# Patient Record
Sex: Male | Born: 1975 | Race: Black or African American | Smoking: Current every day smoker
Health system: Northeastern US, Academic
[De-identification: ages and names within clinical notes are randomized; demographics above are authoritative.]

## PROBLEM LIST (undated history)

## (undated) DIAGNOSIS — I10 Essential (primary) hypertension: Secondary | ICD-10-CM

## (undated) DIAGNOSIS — F1910 Other psychoactive substance abuse, uncomplicated: Secondary | ICD-10-CM

## (undated) DIAGNOSIS — I519 Heart disease, unspecified: Secondary | ICD-10-CM

## (undated) DIAGNOSIS — S81839A Puncture wound without foreign body, unspecified lower leg, initial encounter: Secondary | ICD-10-CM

## (undated) DIAGNOSIS — Z945 Skin transplant status: Secondary | ICD-10-CM

## (undated) DIAGNOSIS — W3400XA Accidental discharge from unspecified firearms or gun, initial encounter: Secondary | ICD-10-CM

## (undated) HISTORY — DX: Other psychoactive substance abuse, uncomplicated: F19.10

## (undated) HISTORY — PX: OTHER SURGICAL HISTORY: SHX169

## (undated) HISTORY — DX: Essential (primary) hypertension: I10

## (undated) HISTORY — DX: Puncture wound without foreign body, unspecified lower leg, initial encounter: S81.839A

## (undated) HISTORY — DX: Heart disease, unspecified: I51.9

---

## 2012-06-01 ENCOUNTER — Emergency Department (HOSPITAL_COMMUNITY): Payer: Medicare (Managed Care)

## 2012-06-01 ENCOUNTER — Emergency Department (HOSPITAL_COMMUNITY)
Admission: EM | Admit: 2012-06-01 | Discharge: 2012-06-01 | Disposition: A | Discharge status: Home / Self Care | Payer: Medicare (Managed Care) | Attending: Emergency Medicine | Admitting: Emergency Medicine

## 2012-06-01 DIAGNOSIS — I10 Essential (primary) hypertension: Secondary | ICD-10-CM | POA: Insufficient documentation

## 2012-06-01 DIAGNOSIS — R51 Headache: Secondary | ICD-10-CM | POA: Insufficient documentation

## 2012-06-01 DIAGNOSIS — M2550 Pain in unspecified joint: Secondary | ICD-10-CM | POA: Insufficient documentation

## 2012-06-01 LAB — CBC
HCT: 45.5 % (ref 39.0–52.0)
Hemoglobin: 16.9 g/dL (ref 13.0–17.0)
MCH: 33.9 pg (ref 26.0–34.0)
MCHC: 37.1 g/dL — ABNORMAL HIGH (ref 30.0–36.0)
MCV: 91.2 fL (ref 78.0–100.0)
RBC: 4.99 MIL/uL (ref 4.22–5.81)

## 2012-06-01 LAB — POCT I-STAT, CHEM 8
BUN: 12 mg/dL (ref 6–23)
Calcium, Ion: 1.2 mmol/L (ref 1.12–1.23)
Creatinine, Ser: 1 mg/dL (ref 0.50–1.35)
Glucose, Bld: 91 mg/dL (ref 70–99)
Hemoglobin: 17.3 g/dL — ABNORMAL HIGH (ref 13.0–17.0)
Sodium: 141 mEq/L (ref 135–145)
TCO2: 30 mmol/L (ref 0–100)

## 2012-06-01 LAB — POCT I-STAT TROPONIN I

## 2012-06-01 MED ORDER — HYDROCHLOROTHIAZIDE 25 MG PO TABS
25.0000 mg | ORAL_TABLET | Freq: Every day | ORAL | Status: DC
  Administered 2012-06-01: 25 mg via ORAL
  Filled 2012-06-01: qty 1

## 2012-06-01 MED ORDER — HYDROCHLOROTHIAZIDE 25 MG PO TABS: 25.0000 mg | ORAL_TABLET | Freq: Every day | ORAL | Status: DC

## 2012-06-01 NOTE — ED Notes (Signed)
Pt placed on monitor, EKG performed

## 2012-06-01 NOTE — ED Provider Notes (Signed)
History     CSN: [id number]  Arrival date & time 06/01/12  4098   First MD Initiated Contact with Patient 06/01/12 [phone]      Chief Complaint  Patient presents with  . Hypertension    (Consider location/radiation/quality/duration/timing/severity/associated sxs/prior treatment) HPI Comments: 37 y/o male with a PMHx of HTN presents to the ED with his girlfriend with concerns of worsening HTN x 2-3 months. Patient states he has been off of his HCTZ for the past few months after moving from [place], [place] and has been getting headaches on and off "when his blood pressure is up". Last night he had bilateral arm pain worse on the L, his girlfriend checked his blood pressure which was 165/115 and he had a headache at that time. Blood pressure this morning was still elevated and girlfriend advised him to go to the ED. Denies chest pain, sob, nausea, vomiting, visual changes, dizziness. Patient is a chronic daily smoker. Unsure of family hx of heart disease.   Patient is a 37 y.o. male presenting with hypertension. The history is provided by the patient and a significant other.  Hypertension Associated symptoms include arthralgias (positive for arm pain) and headaches. Pertinent negatives include no chest pain, chills, fever, nausea, neck pain or vomiting.    No past medical history on file.  No past surgical history on file.  No family history on file.  History  Substance Use Topics  . Smoking status: Not on file  . Smokeless tobacco: Not on file  . Alcohol Use: Not on file      Review of Systems  Constitutional: Negative for fever and chills.  HENT: Negative for neck pain and neck stiffness.   Eyes: Negative for visual disturbance.  Respiratory: Negative for shortness of breath.   Cardiovascular: Negative for chest pain.  Gastrointestinal: Negative for nausea and vomiting.  Musculoskeletal: Positive for arthralgias (positive for arm pain).  Neurological: Positive for headaches.  Negative for dizziness.  All other systems reviewed and are negative.    Allergies  Review of patient's allergies indicates no known allergies.  Home Medications   Current Outpatient Rx  Name  Route  Sig  Dispense  Refill  . ibuprofen (ADVIL,MOTRIN) 200 MG tablet   Oral   Take 400 mg by mouth every 6 (six) hours as needed for pain (for headache.).           BP 148/101  Pulse 89  Temp(Src) 98.5 F (36.9 C) (Oral)  Resp 18  Ht 5\' 8"  (1.727 m)  Wt 190 lb (86.183 kg)  BMI 28.9 kg/m2  SpO2 99%  Physical Exam  Nursing note and vitals reviewed. Constitutional: He is oriented to person, place, and time. He appears well-developed and well-nourished. No distress.  HENT:  Head: Normocephalic and atraumatic.  Mouth/Throat: Oropharynx is clear and moist.  Eyes: Conjunctivae and EOM are normal. Pupils are equal, round, and reactive to light.  Neck: Normal range of motion. Neck supple. No JVD present.  Cardiovascular: Normal rate, regular rhythm, normal heart sounds and intact distal pulses.   No extremity edema.  Pulmonary/Chest: Effort normal and breath sounds normal. No respiratory distress. He has no wheezes. He has no rales.  Abdominal: Soft. Bowel sounds are normal. There is no tenderness.  Musculoskeletal: Normal range of motion. He exhibits no edema.  Neurological: He is alert and oriented to person, place, and time. No sensory deficit.  Skin: Skin is warm and dry. He is not diaphoretic. No erythema. No pallor.  Psychiatric: He has a normal mood and affect. His behavior is normal.    ED Course  Procedures (including critical care time)  Labs Reviewed  CBC - Abnormal; Notable for the following:    MCHC 37.1 (*)    Platelets 146 (*)    All other components within normal limits  POCT I-STAT, CHEM 8 - Abnormal; Notable for the following:    Hemoglobin 17.3 (*)    All other components within normal limits  POCT I-STAT TROPONIN I   Dg Chest 2 View  06/01/2012   *RADIOLOGY REPORT*  Clinical Data: Hypertension headache  CHEST - 2 VIEW  Comparison: None.  Findings: Lungs are mildly hyperexpanded but clear.  Heart size and pulmonary vascularity are normal.  No adenopathy.  No bone lesions.  IMPRESSION: No edema or consolidation.  Lungs are mildly hyperexpanded.   Original Report Authenticated By: Bretta Bang, M.D.     Date: 06/01/2012  Rate: 60  Rhythm: normal sinus rhythm  QRS Axis: right  Intervals: normal  ST/T Wave abnormalities: normal  Conduction Disutrbances:none  Narrative Interpretation: no stemi  Old EKG Reviewed: none available    1. Hypertension       MDM  37 y/o male with HTN. Blood pressure in ED 152/115 - 148/101. Currently does not have a headache. Obtaining labs, ekg and cxr to r/o any cardiac problem associated with the HTN due to the L arm pain. Patient states it is bilateral arms and believes it is from working out, however girlfriend says last night he was complaining of left arm pain. Will give HCTZ 25mg  and if workup negative, will discharge with rx for HCTZ and follow up with PCP and outpatient cardiology. Case discussed with Dr. Judd Lien who agrees with plan of care.  10:59 AM Cardiac workup negative. EKG normal other than RAD. Labs and CXR without any acute problem. BP 132/101 in room at this time. Will discharge with HCTZ. Resource guide given for PCP follow up and he will f/u with cardiology outpatient. Return precautions discussed. Patient states understanding of plan and is agreeable.      Trevor Mace, PA-C 06/01/12 1101

## 2012-06-01 NOTE — ED Notes (Signed)
Pt has returned from being out of the department; placed back on continuous pulse oximetry and blood pressure cuff; family at bedside

## 2012-06-01 NOTE — ED Notes (Signed)
Pt undressed, in gown, on continuous pulse oximetry and blood pressure cuff; family at bedside 

## 2012-06-02 NOTE — ED Provider Notes (Signed)
Medical screening examination/treatment/procedure(s) were performed by non-physician practitioner and as supervising physician I was immediately available for consultation/collaboration.  Geoffery Lyons, MD 06/02/12 [phone]

## 2015-12-20 ENCOUNTER — Encounter (HOSPITAL_COMMUNITY): Payer: Self-pay

## 2015-12-20 ENCOUNTER — Emergency Department (HOSPITAL_COMMUNITY): Payer: Self-pay

## 2015-12-20 ENCOUNTER — Encounter (HOSPITAL_COMMUNITY): Payer: Self-pay | Admitting: Emergency Medicine

## 2015-12-20 ENCOUNTER — Emergency Department (HOSPITAL_COMMUNITY)
Admission: EM | Admit: 2015-12-20 | Discharge: 2015-12-20 | Disposition: A | Discharge status: Home / Self Care | Payer: Medicare (Managed Care) | Attending: Emergency Medicine | Admitting: Emergency Medicine

## 2015-12-20 ENCOUNTER — Emergency Department (HOSPITAL_COMMUNITY)
Admission: EM | Admit: 2015-12-20 | Discharge: 2015-12-20 | Disposition: A | Discharge status: Home / Self Care | Payer: Self-pay | Attending: Emergency Medicine | Admitting: Emergency Medicine

## 2015-12-20 DIAGNOSIS — F17210 Nicotine dependence, cigarettes, uncomplicated: Secondary | ICD-10-CM | POA: Insufficient documentation

## 2015-12-20 DIAGNOSIS — K2920 Alcoholic gastritis without bleeding: Secondary | ICD-10-CM | POA: Insufficient documentation

## 2015-12-20 DIAGNOSIS — Y939 Activity, unspecified: Secondary | ICD-10-CM | POA: Insufficient documentation

## 2015-12-20 DIAGNOSIS — R1084 Generalized abdominal pain: Secondary | ICD-10-CM

## 2015-12-20 DIAGNOSIS — I10 Essential (primary) hypertension: Secondary | ICD-10-CM | POA: Insufficient documentation

## 2015-12-20 DIAGNOSIS — Z79899 Other long term (current) drug therapy: Secondary | ICD-10-CM | POA: Insufficient documentation

## 2015-12-20 DIAGNOSIS — M7022 Olecranon bursitis, left elbow: Secondary | ICD-10-CM | POA: Insufficient documentation

## 2015-12-20 HISTORY — DX: Essential (primary) hypertension: I10

## 2015-12-20 LAB — ETHANOL: ALCOHOL ETHYL (B): 30 mg/dL — AB (ref <5)

## 2015-12-20 LAB — COMPREHENSIVE METABOLIC PANEL
ALK PHOS: 73 U/L (ref 38–126)
ALT: 26 U/L (ref 17–63)
ANION GAP: 13 (ref 5–15)
AST: 32 U/L (ref 15–41)
Albumin: 4.3 g/dL (ref 3.5–5.0)
BUN: 9 mg/dL (ref 6–20)
CALCIUM: 9.2 mg/dL (ref 8.9–10.3)
CO2: 21 mmol/L — ABNORMAL LOW (ref 22–32)
CREATININE: 0.95 mg/dL (ref 0.61–1.24)
Chloride: 102 mmol/L (ref 101–111)
Glucose, Bld: 102 mg/dL — ABNORMAL HIGH (ref 65–99)
Potassium: 3.7 mmol/L (ref 3.5–5.1)
Sodium: 136 mmol/L (ref 135–145)
TOTAL PROTEIN: 7.6 g/dL (ref 6.5–8.1)
Total Bilirubin: 0.9 mg/dL (ref 0.3–1.2)

## 2015-12-20 LAB — URINALYSIS, ROUTINE W REFLEX MICROSCOPIC
BILIRUBIN URINE: NEGATIVE
Glucose, UA: NEGATIVE mg/dL
Hgb urine dipstick: NEGATIVE
Ketones, ur: NEGATIVE mg/dL
LEUKOCYTES UA: NEGATIVE
NITRITE: NEGATIVE
Protein, ur: NEGATIVE mg/dL
SPECIFIC GRAVITY, URINE: 1.019 (ref 1.005–1.030)
pH: 6 (ref 5.0–8.0)

## 2015-12-20 LAB — CBC WITH DIFFERENTIAL/PLATELET
BASOS ABS: 0.1 10*3/uL (ref 0.0–0.1)
BASOS PCT: 1 %
EOS ABS: 0.5 10*3/uL (ref 0.0–0.7)
Eosinophils Relative: 8 %
HCT: 51 % (ref 39.0–52.0)
HEMOGLOBIN: 18.1 g/dL — AB (ref 13.0–17.0)
Lymphocytes Relative: 31 %
Lymphs Abs: 1.7 10*3/uL (ref 0.7–4.0)
MCH: 34 pg (ref 26.0–34.0)
MCHC: 35.5 g/dL (ref 30.0–36.0)
MCV: 95.7 fL (ref 78.0–100.0)
Monocytes Absolute: 0.7 10*3/uL (ref 0.1–1.0)
Monocytes Relative: 13 %
NEUTROS PCT: 47 %
Neutro Abs: 2.6 10*3/uL (ref 1.7–7.7)
Platelets: 177 10*3/uL (ref 150–400)
RBC: 5.33 MIL/uL (ref 4.22–5.81)
RDW: 13.3 % (ref 11.5–15.5)
WBC: 5.5 10*3/uL (ref 4.0–10.5)

## 2015-12-20 LAB — LIPASE, BLOOD: LIPASE: 29 U/L (ref 11–51)

## 2015-12-20 MED ORDER — ONDANSETRON 4 MG PO TBDP: 4.0000 mg | ORAL_TABLET | Freq: Three times a day (TID) | ORAL | 0 refills | Status: DC | PRN

## 2015-12-20 MED ORDER — SODIUM CHLORIDE 0.9 % IV SOLN: INTRAVENOUS | Status: DC

## 2015-12-20 MED ORDER — DOXYCYCLINE HYCLATE 100 MG PO TABS
100.0000 mg | ORAL_TABLET | Freq: Once | ORAL | Status: AC
  Administered 2015-12-20: 100 mg via ORAL
  Filled 2015-12-20: qty 1

## 2015-12-20 MED ORDER — DOXYCYCLINE HYCLATE 100 MG PO CAPS: 100.0000 mg | ORAL_CAPSULE | Freq: Two times a day (BID) | ORAL | 0 refills | Status: AC

## 2015-12-20 MED ORDER — SODIUM CHLORIDE 0.9 % IV BOLUS (SEPSIS)
1000.0000 mL | Freq: Once | INTRAVENOUS | Status: AC
  Administered 2015-12-20: 1000 mL via INTRAVENOUS

## 2015-12-20 MED ORDER — FAMOTIDINE IN NACL 20-0.9 MG/50ML-% IV SOLN
20.0000 mg | Freq: Once | INTRAVENOUS | Status: AC
  Administered 2015-12-20: 20 mg via INTRAVENOUS
  Filled 2015-12-20: qty 50

## 2015-12-20 MED ORDER — KETOROLAC TROMETHAMINE 60 MG/2ML IM SOLN
60.0000 mg | Freq: Once | INTRAMUSCULAR | Status: AC
  Administered 2015-12-20: 60 mg via INTRAMUSCULAR
  Filled 2015-12-20: qty 2

## 2015-12-20 MED ORDER — NAPROXEN 375 MG PO TABS: 375.0000 mg | ORAL_TABLET | Freq: Two times a day (BID) | ORAL | 0 refills | Status: AC

## 2015-12-20 MED ORDER — ONDANSETRON HCL 4 MG/2ML IJ SOLN
4.0000 mg | Freq: Once | INTRAMUSCULAR | Status: AC
  Administered 2015-12-20: 4 mg via INTRAVENOUS
  Filled 2015-12-20: qty 2

## 2015-12-20 MED ORDER — FAMOTIDINE 20 MG PO TABS: 20.0000 mg | ORAL_TABLET | Freq: Two times a day (BID) | ORAL | 0 refills | Status: AC

## 2015-12-20 NOTE — ED Triage Notes (Addendum)
Pt presents via EMS with pain and swelling to L elbow.  8/10 pain aching pain.  Also c/o headache and gas-like pain in chest.  EMS gave unknown amount of aspirin en route.  Pt sts the elbow pain is "weird" and he is concerned.

## 2015-12-20 NOTE — ED Provider Notes (Signed)
[facility]-EMERGENCY DEPT Provider Note   CSN: [id number] Arrival date & time: 12/20/15  [id number] By signing my name below, I, Bridgette HabermannMaria Tan, attest that this documentation has been prepared under the direction and in the presence of Shaune Pollackameron Evelen Vazguez, MD. Electronically Signed: Bridgette HabermannMaria Tan, ED Scribe. 12/20/15. 1:05 AM.  History   Chief Complaint Chief Complaint  Patient presents with  . Elbow Pain   HPI Comments: Spencer Greene is a 40 y.o. male with no pertinent PMHx, who presents to the Emergency Department by EMS complaining of aching and sharp left elbow pain with swelling onset one day ago. Pt also has associated generalized arthralgias/myalgias. He denies any recent injury or trauma but notes that he has been drinking alcohol tonight. Pain is exacerbated with movement. Pt has not tried any OTC medications PTA. Pt is right-hand dominant. Pt denies numbness, fever or any other associated symptoms. No rash or wounds. Denies pain with ROM at elbow.  The history is provided by the patient. No language interpreter was used.    No past medical history on file.  There are no active problems to display for this patient.   No past surgical history on file.     Home Medications    Prior to Admission medications   Not on File    Family History No family history on file.  Social History Social History  Substance Use Topics  . Smoking status: Not on file  . Smokeless tobacco: Not on file  . Alcohol use Not on file     Allergies   Review of patient's allergies indicates not on file.   Review of Systems Review of Systems  Constitutional: Negative for chills and fever.  Respiratory: Negative for shortness of breath.   Cardiovascular: Negative for chest pain.  Musculoskeletal: Positive for arthralgias, joint swelling and myalgias. Negative for neck pain.  Skin: Negative for rash and wound.  Allergic/Immunologic: Negative for immunocompromised state.  Neurological: Negative for weakness  and numbness.  Hematological: Does not bruise/bleed easily.     Physical Exam Updated Vital Signs BP (!) 143/104   Pulse 94   Temp 98.1 F (36.7 C) (Oral)   Resp 19   SpO2 96%   Physical Exam  Constitutional: He is oriented to person, place, and time. He appears well-developed and well-nourished. No distress.  HENT:  Head: Normocephalic and atraumatic.  Eyes: Conjunctivae are normal.  Neck: Neck supple.  Cardiovascular: Normal rate, regular rhythm and normal heart sounds.   Pulmonary/Chest: Effort normal. No respiratory distress. He has no wheezes.  Abdominal: He exhibits no distension.  Musculoskeletal: He exhibits no edema.  Neurological: He is alert and oriented to person, place, and time. He exhibits normal muscle tone.  Skin: Skin is warm. Capillary refill takes less than 2 seconds. No rash noted.  Nursing note and vitals reviewed.   UPPER EXTREMITY EXAM: Left  INSPECTION & PALPATION: Moderate tenderness to palpation directly over olecranon bursa. There is a superficial abrasion but no surrounding erythema, induration, or fluctuance. Range of motion is full and painless at the elbow, with only minimal tenderness upon full flexion and stretching of the bursa.  SENSORY: Sensation is intact to light touch in:  Superficial radial nerve distribution (dorsal first web space) Median nerve distribution (tip of index finger)   Ulnar nerve distribution (tip of small finger)     MOTOR:  + Motor posterior interosseous nerve (thumb IP extension) + Anterior interosseous nerve (thumb IP flexion, index finger DIP flexion) + Radial nerve (wrist  extension) + Median nerve (palpable firing thenar mass) + Ulnar nerve (palpable firing of first dorsal interosseous muscle)  VASCULAR: 2+ radial pulse Brisk capillary refill < 2 sec, fingers warm and well-perfused   ED Treatments / Results  DIAGNOSTIC STUDIES: Oxygen Saturation is 99% on RA, normal by my interpretation.     COORDINATION OF CARE: 1:04 AM Discussed treatment plan with pt at bedside which includes lab work, Rx of antibiotics and anti= and pt agreed to plan.  Labs (all labs ordered are listed, but only abnormal results are displayed) Labs Reviewed - No data to display  EKG  EKG Interpretation None       Radiology No results found.  Procedures Procedures (including critical care time)  Medications Ordered in ED Medications  ketorolac (TORADOL) injection 60 mg (not administered)  doxycycline (VIBRA-TABS) tablet 100 mg (not administered)     Initial Impression / Assessment and Plan / ED Course  I have reviewed the triage vital signs and the nursing notes.  Pertinent labs & imaging results that were available during my care of the patient were reviewed by me and considered in my medical decision making (see chart for details).  Clinical Course  40 year old previously healthy male who presents with mild, painful swelling over her left elbow. Exam is consistent with mild olecranon bursitis. There are signs of possible recent trauma and patient admits to recent alcohol use and possibly sleeping "on it.". No injection drug use. There is no erythema, induration, or signs of cellulitis. No fluctuance or evidence to suggest abscess. Plain films show no acute fracture. Suspect mild, likely traumatic olecranon bursitis. However, given poor follow-up and possible trauma to the area, will cover for possible early infectious process as well with doxycycline. Will give NSAIDs. Referred for outpt ortho follow-up.  Final Clinical Impressions(s) / ED Diagnoses   Final diagnoses:  Olecranon bursitis of left elbow    New Prescriptions Discharge Medication List as of 12/20/2015  2:06 AM    START taking these medications   Details  doxycycline (VIBRAMYCIN) 100 MG capsule Take 1 capsule (100 mg total) by mouth 2 (two) times daily., Starting Sat 12/20/2015, Until Sat 12/27/2015, Print    naproxen  (NAPROSYN) 375 MG tablet Take 1 tablet (375 mg total) by mouth 2 (two) times daily with a meal., Starting Sat 12/20/2015, Until Sat 12/27/2015, Print        I personally performed the services described in this documentation, which was scribed in my presence. The recorded information has been reviewed and is accurate.    Shaune Pollack, MD 12/20/15 [phone]

## 2015-12-20 NOTE — ED Triage Notes (Signed)
Pt. D/c here last night for HTN and arm pain. Pt. Unable to go home because abd. Pain so severe. Pt. Reports getting a shot last night that maybe could be causing his pain. Pt. Unable to sit without severe pain. EDP at bedside. Pt. Denies vomiting or diarrhea, but does endorse nausea.

## 2015-12-20 NOTE — ED Provider Notes (Signed)
[facility]PT Provider Note   CSN: [id number] Arrival date & time: 12/20/15  0750     History   Chief Complaint Chief Complaint  Patient presents with  . Abdominal Pain    HPI Spencer Greene is a 40 y.o. male.  Pt presents to the ED this morning due to abdominal pain.  The pt was seen in the ED last night and was dx'd with olecranon bursitis.  The pt was given an injection of toradol.  The pt was discharged around 0300 and was unable to get a ride home.  He has been in the waiting room since then.  He said that he felt very nauseous and his abdomen is painful.  The pt said that he never had anything like this.  He was drinking alcohol last night.      Past Medical History:  Diagnosis Date  . Hypertension     There are no active problems to display for this patient.   History reviewed. No pertinent surgical history.     Home Medications    Prior to Admission medications   Medication Sig Start Date End Date Taking? Authorizing Provider  hydrochlorothiazide (HYDRODIURIL) 25 MG tablet Take 1 tablet (25 mg total) by mouth daily. 06/01/12  Yes Robyn M Hess, PA-C  ibuprofen (ADVIL,MOTRIN) 200 MG tablet Take 400 mg by mouth every 6 (six) hours as needed for pain (for headache.).   Yes Historical Provider, MD  famotidine (PEPCID) 20 MG tablet Take 1 tablet (20 mg total) by mouth 2 (two) times daily. 12/20/15   Jacalyn LefevreJulie Jennylee Uehara, MD  ondansetron (ZOFRAN ODT) 4 MG disintegrating tablet Take 1 tablet (4 mg total) by mouth every 8 (eight) hours as needed for nausea or vomiting. 12/20/15   Jacalyn LefevreJulie Suresh Audi, MD    Family History History reviewed. No pertinent family history.  Social History Social History  Substance Use Topics  . Smoking status: Current Every Day Smoker    Packs/day: 1.50    Types: Cigarettes  . Smokeless tobacco: Never Used  . Alcohol use 7.2 oz/week    12 Cans of beer per week     Comment: 12 pack every other day     Allergies   Review of patient's  allergies indicates no known allergies.   Review of Systems Review of Systems  Gastrointestinal: Positive for abdominal pain and nausea.  All other systems reviewed and are negative.    Physical Exam Updated Vital Signs BP (!) 158/114   Pulse 94   Temp 97.9 F (36.6 C) (Oral)   Resp 15   Ht 5\' 7"  (1.702 m)   Wt 160 lb (72.6 kg)   SpO2 97%   BMI 25.06 kg/m   Physical Exam  Constitutional: He is oriented to person, place, and time. He appears well-developed and well-nourished.  HENT:  Head: Normocephalic and atraumatic.  Right Ear: External ear normal.  Left Ear: External ear normal.  Nose: Nose normal.  Mouth/Throat: Oropharynx is clear and moist.  Eyes: Conjunctivae and EOM are normal. Pupils are equal, round, and reactive to light.  Neck: Normal range of motion. Neck supple.  Cardiovascular: Normal rate, regular rhythm, normal heart sounds and intact distal pulses.   Pulmonary/Chest: Effort normal and breath sounds normal.  Abdominal: Soft. Bowel sounds are normal. There is tenderness in the epigastric area.  Musculoskeletal: Normal range of motion.  Neurological: He is alert and oriented to person, place, and time.  Skin: Skin is warm.  Psychiatric: He has a normal mood  and affect. His behavior is normal. Thought content normal.  Nursing note and vitals reviewed.    ED Treatments / Results  Labs (all labs ordered are listed, but only abnormal results are displayed) Labs Reviewed  COMPREHENSIVE METABOLIC PANEL - Abnormal; Notable for the following:       Result Value   CO2 21 (*)    Glucose, Bld 102 (*)    All other components within normal limits  CBC WITH DIFFERENTIAL/PLATELET - Abnormal; Notable for the following:    Hemoglobin 18.1 (*)    All other components within normal limits  URINALYSIS, ROUTINE W REFLEX MICROSCOPIC (NOT AT [facility]) - Abnormal; Notable for the following:    Color, Urine AMBER (*)    All other components within normal limits  ETHANOL -  Abnormal; Notable for the following:    Alcohol, Ethyl (B) 30 (*)    All other components within normal limits  LIPASE, BLOOD    EKG  EKG Interpretation None       Radiology No results found.  Procedures Procedures (including critical care time)  Medications Ordered in ED Medications  sodium chloride 0.9 % bolus 1,000 mL (0 mLs Intravenous Stopped 12/20/15 0926)    And  0.9 %  sodium chloride infusion (not administered)  ondansetron (ZOFRAN) injection 4 mg (4 mg Intravenous Given 12/20/15 0810)  famotidine (PEPCID) IVPB 20 mg premix (0 mg Intravenous Stopped 12/20/15 0926)     Initial Impression / Assessment and Plan / ED Course  I have reviewed the triage vital signs and the nursing notes.  Pertinent labs & imaging results that were available during my care of the patient were reviewed by me and considered in my medical decision making (see chart for details).  Clinical Course    Pt's alcohol was still positive this morning, so I suspect he had quite a bit to drink last night.  I think the combination of toradol and alcohol inflamed his stomach.  The pt is feeling better and is encouraged to avoid alcohol.  He knows to return if worse.  Final Clinical Impressions(s) / ED Diagnoses   Final diagnoses:  Generalized abdominal pain  Acute alcoholic gastritis without hemorrhage    New Prescriptions New Prescriptions   FAMOTIDINE (PEPCID) 20 MG TABLET    Take 1 tablet (20 mg total) by mouth 2 (two) times daily.   ONDANSETRON (ZOFRAN ODT) 4 MG DISINTEGRATING TABLET    Take 1 tablet (4 mg total) by mouth every 8 (eight) hours as needed for nausea or vomiting.     Jacalyn Lefevre, MD 12/20/15 1043

## 2015-12-20 NOTE — ED Notes (Signed)
Patient transported to X-ray via stretcher 

## 2015-12-21 ENCOUNTER — Encounter (HOSPITAL_COMMUNITY): Payer: Self-pay

## 2015-12-25 ENCOUNTER — Emergency Department (HOSPITAL_COMMUNITY)
Admission: EM | Admit: 2015-12-25 | Discharge: 2015-12-25 | Disposition: A | Discharge status: Home / Self Care | Payer: Medicare (Managed Care) | Attending: Emergency Medicine | Admitting: Emergency Medicine

## 2015-12-25 ENCOUNTER — Encounter (HOSPITAL_COMMUNITY): Payer: Self-pay | Admitting: Emergency Medicine

## 2015-12-25 DIAGNOSIS — F17210 Nicotine dependence, cigarettes, uncomplicated: Secondary | ICD-10-CM | POA: Insufficient documentation

## 2015-12-25 DIAGNOSIS — K2900 Acute gastritis without bleeding: Secondary | ICD-10-CM | POA: Insufficient documentation

## 2015-12-25 DIAGNOSIS — I10 Essential (primary) hypertension: Secondary | ICD-10-CM | POA: Insufficient documentation

## 2015-12-25 DIAGNOSIS — Z79899 Other long term (current) drug therapy: Secondary | ICD-10-CM | POA: Insufficient documentation

## 2015-12-25 LAB — CBC
HEMATOCRIT: 47.1 % (ref 39.0–52.0)
HEMOGLOBIN: 16.5 g/dL (ref 13.0–17.0)
MCH: 33.7 pg (ref 26.0–34.0)
MCHC: 35 g/dL (ref 30.0–36.0)
MCV: 96.3 fL (ref 78.0–100.0)
Platelets: 144 10*3/uL — ABNORMAL LOW (ref 150–400)
RBC: 4.89 MIL/uL (ref 4.22–5.81)
RDW: 13.1 % (ref 11.5–15.5)
WBC: 4.6 10*3/uL (ref 4.0–10.5)

## 2015-12-25 LAB — URINALYSIS, ROUTINE W REFLEX MICROSCOPIC
Bilirubin Urine: NEGATIVE
GLUCOSE, UA: NEGATIVE mg/dL
Hgb urine dipstick: NEGATIVE
Ketones, ur: NEGATIVE mg/dL
LEUKOCYTES UA: NEGATIVE
NITRITE: NEGATIVE
PH: 6 (ref 5.0–8.0)
Protein, ur: NEGATIVE mg/dL
SPECIFIC GRAVITY, URINE: 1.012 (ref 1.005–1.030)

## 2015-12-25 LAB — COMPREHENSIVE METABOLIC PANEL
ALT: 39 U/L (ref 17–63)
AST: 40 U/L (ref 15–41)
Albumin: 4.5 g/dL (ref 3.5–5.0)
Alkaline Phosphatase: 69 U/L (ref 38–126)
Anion gap: 9 (ref 5–15)
BUN: 12 mg/dL (ref 6–20)
CO2: 25 mmol/L (ref 22–32)
Calcium: 9 mg/dL (ref 8.9–10.3)
Chloride: 103 mmol/L (ref 101–111)
Creatinine, Ser: 0.99 mg/dL (ref 0.61–1.24)
GFR calc Af Amer: >60 mL/min (ref >60)
GFR calc non Af Amer: >60 mL/min (ref >60)
Glucose, Bld: 107 mg/dL — ABNORMAL HIGH (ref 65–99)
Potassium: 3.6 mmol/L (ref 3.5–5.1)
Sodium: 137 mmol/L (ref 135–145)
Total Bilirubin: 0.8 mg/dL (ref 0.3–1.2)
Total Protein: 7.6 g/dL (ref 6.5–8.1)

## 2015-12-25 LAB — LIPASE, BLOOD: LIPASE: 36 U/L (ref 11–51)

## 2015-12-25 MED ORDER — SODIUM CHLORIDE 0.9 % IV BOLUS (SEPSIS)
1000.0000 mL | Freq: Once | INTRAVENOUS | Status: AC
  Administered 2015-12-25: 1000 mL via INTRAVENOUS

## 2015-12-25 MED ORDER — GI COCKTAIL ~~LOC~~
30.0000 mL | Freq: Once | ORAL | Status: AC
  Administered 2015-12-25: 30 mL via ORAL
  Filled 2015-12-25: qty 30

## 2015-12-25 MED ORDER — FAMOTIDINE IN NACL 20-0.9 MG/50ML-% IV SOLN
20.0000 mg | Freq: Once | INTRAVENOUS | Status: AC
  Administered 2015-12-25: 20 mg via INTRAVENOUS
  Filled 2015-12-25: qty 50

## 2015-12-25 MED ORDER — ONDANSETRON HCL 4 MG/2ML IJ SOLN
4.0000 mg | Freq: Once | INTRAMUSCULAR | Status: AC
  Administered 2015-12-25: 4 mg via INTRAVENOUS
  Filled 2015-12-25: qty 2

## 2015-12-25 MED ORDER — ONDANSETRON 4 MG PO TBDP: 4.0000 mg | ORAL_TABLET | Freq: Three times a day (TID) | ORAL | 0 refills | Status: AC | PRN

## 2015-12-25 NOTE — ED Provider Notes (Signed)
[facility]-EMERGENCY DEPT Provider Note   CSN: [id number] Arrival date & time: 12/25/15  0124     History   Chief Complaint Chief Complaint  Patient presents with  . Abdominal Pain  . Emesis    HPI Spencer Greene is a 40 y.o. male.  Patient is a 40 year old male with no pertinent past medical history presents the ED with complaint of abdominal pain, onset one week. Patient reports he has had constant waxing and waning sharp diffuse abdominal pain for the past week. Denies any aggravating or alleviating factors. Endorses associated nausea and NBNB vomiting. He also reports having episodes of nonbloody loose stool over the past 2 days. Denies fever, chills, chest pain, difficulty breathing, hematemesis, urinary symptoms, blood in urine or stool. Patient reports she has not been able to keep anything down due to his nausea and vomiting. He states he was seen in the ED last week, was advised to stop drinking alcohol and was discharged home with Prilosec. Patient reports he has taken 2 doses of Prilosec without relief and states he stopped taking the medication due to having continued abdominal pain. Patient reports drinking 2 beers daily and notes he last had a beer yesterday. Denies history of abdominal surgeries.      Past Medical History:  Diagnosis Date  . Hypertension     There are no active problems to display for this patient.   History reviewed. No pertinent surgical history.     Home Medications    Prior to Admission medications   Medication Sig Start Date End Date Taking? Authorizing Provider  hydrochlorothiazide (HYDRODIURIL) 25 MG tablet Take 1 tablet (25 mg total) by mouth daily. 06/01/12  Yes Robyn M Hess, PA-C  ibuprofen (ADVIL,MOTRIN) 200 MG tablet Take 400 mg by mouth every 6 (six) hours as needed for pain (for headache.).   Yes Historical Provider, MD  doxycycline (VIBRAMYCIN) 100 MG capsule Take 1 capsule (100 mg total) by mouth 2 (two) times daily. Patient not  taking: Reported on 12/25/2015 12/20/15 12/27/15  Shaune Pollackameron Isaacs, MD  famotidine (PEPCID) 20 MG tablet Take 1 tablet (20 mg total) by mouth 2 (two) times daily. Patient not taking: Reported on 12/25/2015 12/20/15   Jacalyn LefevreJulie Haviland, MD  naproxen (NAPROSYN) 375 MG tablet Take 1 tablet (375 mg total) by mouth 2 (two) times daily with a meal. Patient not taking: Reported on 12/25/2015 12/20/15 12/27/15  Shaune Pollackameron Isaacs, MD  ondansetron (ZOFRAN ODT) 4 MG disintegrating tablet Take 1 tablet (4 mg total) by mouth every 8 (eight) hours as needed for nausea or vomiting. 12/25/15   Chase PicketJaime Pilcher Ward, PA-C    Family History History reviewed. No pertinent family history.  Social History Social History  Substance Use Topics  . Smoking status: Current Every Day Smoker    Packs/day: 1.50    Types: Cigarettes  . Smokeless tobacco: Never Used  . Alcohol use 7.2 oz/week    12 Cans of beer per week     Comment: 12 pack every other day     Allergies   Review of patient's allergies indicates no known allergies.   Review of Systems Review of Systems  Gastrointestinal: Positive for abdominal pain, diarrhea, nausea and vomiting.  All other systems reviewed and are negative.    Physical Exam Updated Vital Signs BP (!) 149/111   Pulse 85   Temp 97.8 F (36.6 C) (Oral)   Resp 18   Ht 5\' 7"  (1.702 m)   Wt 72.6 kg   SpO2 98%  BMI 25.06 kg/m   Physical Exam  Constitutional: He is oriented to person, place, and time. He appears well-developed and well-nourished. No distress.  HENT:  Head: Normocephalic and atraumatic.  Mouth/Throat: Oropharynx is clear and moist. No oropharyngeal exudate.  Eyes: Conjunctivae and EOM are normal. Right eye exhibits no discharge. Left eye exhibits no discharge. No scleral icterus.  Neck: Normal range of motion. Neck supple.  Cardiovascular: Normal rate, regular rhythm, normal heart sounds and intact distal pulses.   Pulmonary/Chest: Effort normal and breath  sounds normal. No respiratory distress. He has no wheezes. He has no rales. He exhibits no tenderness.  Abdominal: Soft. Bowel sounds are normal. He exhibits no distension and no mass. There is tenderness (epigastric). There is no rebound and no guarding. No hernia.  No CVA tenderness  Musculoskeletal: Normal range of motion. He exhibits no edema.  Neurological: He is alert and oriented to person, place, and time.  Skin: Skin is warm and dry. He is not diaphoretic.  Nursing note and vitals reviewed.    ED Treatments / Results  Labs (all labs ordered are listed, but only abnormal results are displayed) Labs Reviewed  COMPREHENSIVE METABOLIC PANEL - Abnormal; Notable for the following:       Result Value   Glucose, Bld 107 (*)    All other components within normal limits  CBC - Abnormal; Notable for the following:    Platelets 144 (*)    All other components within normal limits  LIPASE, BLOOD  URINALYSIS, ROUTINE W REFLEX MICROSCOPIC (NOT AT [facility])    EKG  EKG Interpretation None       Radiology No results found.  Procedures Procedures (including critical care time)  Medications Ordered in ED Medications  ondansetron (ZOFRAN) injection 4 mg (4 mg Intravenous Given 12/25/15 0508)  famotidine (PEPCID) IVPB 20 mg premix (0 mg Intravenous Stopped 12/25/15 0557)  gi cocktail (Maalox,Lidocaine,Donnatal) (30 mLs Oral Given 12/25/15 4098)  sodium chloride 0.9 % bolus 1,000 mL (0 mLs Intravenous Stopped 12/25/15 0813)     Initial Impression / Assessment and Plan / ED Course  I have reviewed the triage vital signs and the nursing notes.  Pertinent labs & imaging results that were available during my care of the patient were reviewed by me and considered in my medical decision making (see chart for details).  Clinical Course   Pt presents with abdominal pain, N/V for the past week. Endorses drinking alcohol daily, last drank 2 days ago. Reports he was seen in the ED for  similar sxs on 10/21 and was d/c home with prilosec which he has taken twice without relief. VSS. Exam revealed mild TTP over epigastric region, no peritoneal signs. MMM. Remaining exam unremarkable. Pt given antiemetics and pepcid. Labs and UA unremarkable. On reevaluation pt reports his N/V have resolved but he continues to have abdominal pain. Pt given GI cocktail. Suspect pt's sxs are likely due to alcohol abuse/reflux. Plan to reevaluation pt s/p fluid challenge with plan to d/c pt home with instructions regarding alcohol cessation.   Hand-off to [facility], PA-C.   Final Clinical Impressions(s) / ED Diagnoses   Final diagnoses:  Acute gastritis without hemorrhage, unspecified gastritis type    New Prescriptions Discharge Medication List as of 12/25/2015  7:41 AM       Barrett Henle, PA-C 12/25/15 1458    Tomasita Crumble, MD 12/25/15 2302

## 2015-12-25 NOTE — Discharge Instructions (Signed)
Please follow-up with your primary care provider or the GI clinic listed above. I have provided information for a primary care provider in the area if you need to find a doctor. Take Zofran as needed for nausea. Continue taking Pepcid as directed. It is important to quit or decrease your alcohol consumption to help improve your abdominal pain.  Please seek immediate care if you develop any of the following symptoms: The pain does not go away.  You have a fever.  You keep throwing up (vomiting).  You pass bloody or black tarry stools.  There is bright red blood in the stool.  There is rectal pain.  You do not seem to be getting better.  You have any questions or concerns.

## 2015-12-25 NOTE — ED Provider Notes (Signed)
Care assumed from previous provider PA Nadeau. Please see note for further details. Case discussed, plan agreed upon. Will re-evaluate following GI cocktail and PO challenge with likely discharge to home.   7:32 AM - patient reevaluated following fluid bolus and GI cocktail. States n/v have resolved. Able to tolerate PO. Mildly improved abdominal pain. Abdominal exam soft and nondistended with no peritoneal signs. Mild tenderness to palpation of the epigastrium. Negative Murphy's. Agree with assessment of previous provider. Will give Rx for Zofran. Continue Pepcid. Discussed ETOH cessation. PCP or GI follow-up discussed and information given at discharge. Reasons to return to ED discussed and all questions answered.    [facility] Lc[facility]ime Pilcher Mykael Trott, PA-C 12/25/15 [phone]    Benjiman CoreNathan Pickering, MD 12/25/15 1534

## 2015-12-25 NOTE — ED Notes (Signed)
Pt refused to sign discharge paperwork, stating this visit was just "a waste of time". He left the department mumbling his dissatisfactions.

## 2015-12-25 NOTE — ED Triage Notes (Signed)
Pt reports having abd pain with nausea, vomiting, and diarrhea. Pt states that he stopped drinking alcohol and pain has not improved. Also reports having elevated blood pressure and not currently on medications.

## 2016-05-28 ENCOUNTER — Encounter (HOSPITAL_COMMUNITY): Payer: Self-pay | Admitting: *Deleted

## 2016-05-28 ENCOUNTER — Emergency Department (HOSPITAL_COMMUNITY)
Admission: EM | Admit: 2016-05-28 | Discharge: 2016-05-28 | Disposition: A | Discharge status: Home / Self Care | Payer: Medicare (Managed Care) | Attending: Dermatology | Admitting: Dermatology

## 2016-05-28 DIAGNOSIS — R109 Unspecified abdominal pain: Secondary | ICD-10-CM | POA: Insufficient documentation

## 2016-05-28 DIAGNOSIS — Z5321 Procedure and treatment not carried out due to patient leaving prior to being seen by health care provider: Secondary | ICD-10-CM | POA: Insufficient documentation

## 2016-05-28 LAB — CBC
HCT: 45.9 % (ref 39.0–52.0)
Hemoglobin: 16.6 g/dL (ref 13.0–17.0)
MCH: 33.7 pg (ref 26.0–34.0)
MCHC: 36.2 g/dL — AB (ref 30.0–36.0)
MCV: 93.3 fL (ref 78.0–100.0)
PLATELETS: 158 10*3/uL (ref 150–400)
RBC: 4.92 MIL/uL (ref 4.22–5.81)
RDW: 13.4 % (ref 11.5–15.5)
WBC: 6.1 10*3/uL (ref 4.0–10.5)

## 2016-05-28 LAB — COMPREHENSIVE METABOLIC PANEL
ALK PHOS: 72 U/L (ref 38–126)
ALT: 30 U/L (ref 17–63)
AST: 32 U/L (ref 15–41)
Albumin: 4.4 g/dL (ref 3.5–5.0)
Anion gap: 13 (ref 5–15)
BUN: 5 mg/dL — AB (ref 6–20)
CALCIUM: 9.2 mg/dL (ref 8.9–10.3)
CO2: 22 mmol/L (ref 22–32)
CREATININE: 0.91 mg/dL (ref 0.61–1.24)
Chloride: 101 mmol/L (ref 101–111)
GFR calc Af Amer: >60 mL/min (ref >60)
GFR calc non Af Amer: >60 mL/min (ref >60)
GLUCOSE: 102 mg/dL — AB (ref 65–99)
Potassium: 3.4 mmol/L — ABNORMAL LOW (ref 3.5–5.1)
SODIUM: 136 mmol/L (ref 135–145)
Total Bilirubin: 0.5 mg/dL (ref 0.3–1.2)
Total Protein: 7.6 g/dL (ref 6.5–8.1)

## 2016-05-28 LAB — LIPASE, BLOOD: Lipase: 15 U/L (ref 11–51)

## 2016-05-28 NOTE — ED Notes (Signed)
Called to take Pt to [facility]- No Answer. Times 3

## 2016-05-28 NOTE — ED Notes (Signed)
Pt reports pain under L ribcage, pt states, "It feels like gas pains." denies recent cocaine use

## 2016-05-28 NOTE — ED Triage Notes (Addendum)
Pt c/o abd pain onset yesterday, Last BM yesterday, pt reports x 2 vomiting episodes, pt c/o HA, pt reports drinking beer last night, pt states, "I ate some burnt ribs at work yesterday, A&O x4

## 2016-06-16 ENCOUNTER — Encounter (HOSPITAL_COMMUNITY): Payer: Self-pay | Admitting: Emergency Medicine

## 2016-06-16 ENCOUNTER — Emergency Department (HOSPITAL_COMMUNITY)
Admission: EM | Admit: 2016-06-16 | Discharge: 2016-06-16 | Disposition: A | Discharge status: Home / Self Care | Payer: Medicare (Managed Care) | Attending: Emergency Medicine | Admitting: Emergency Medicine

## 2016-06-16 DIAGNOSIS — F17210 Nicotine dependence, cigarettes, uncomplicated: Secondary | ICD-10-CM | POA: Insufficient documentation

## 2016-06-16 DIAGNOSIS — F10120 Alcohol abuse with intoxication, uncomplicated: Secondary | ICD-10-CM | POA: Insufficient documentation

## 2016-06-16 DIAGNOSIS — F1910 Other psychoactive substance abuse, uncomplicated: Secondary | ICD-10-CM

## 2016-06-16 DIAGNOSIS — Z79899 Other long term (current) drug therapy: Secondary | ICD-10-CM | POA: Insufficient documentation

## 2016-06-16 DIAGNOSIS — F1410 Cocaine abuse, uncomplicated: Secondary | ICD-10-CM | POA: Insufficient documentation

## 2016-06-16 DIAGNOSIS — F10920 Alcohol use, unspecified with intoxication, uncomplicated: Secondary | ICD-10-CM

## 2016-06-16 DIAGNOSIS — I10 Essential (primary) hypertension: Secondary | ICD-10-CM | POA: Insufficient documentation

## 2016-06-16 DIAGNOSIS — R319 Hematuria, unspecified: Secondary | ICD-10-CM

## 2016-06-16 DIAGNOSIS — F1210 Cannabis abuse, uncomplicated: Secondary | ICD-10-CM | POA: Insufficient documentation

## 2016-06-16 LAB — COMPREHENSIVE METABOLIC PANEL
ALK PHOS: 82 U/L (ref 38–126)
ALT: 24 U/L (ref 17–63)
ANION GAP: 11 (ref 5–15)
AST: 26 U/L (ref 15–41)
Albumin: 4.6 g/dL (ref 3.5–5.0)
BUN: 9 mg/dL (ref 6–20)
CO2: 26 mmol/L (ref 22–32)
CREATININE: 0.97 mg/dL (ref 0.61–1.24)
Calcium: 9.4 mg/dL (ref 8.9–10.3)
Chloride: 102 mmol/L (ref 101–111)
GFR calc non Af Amer: >60 mL/min (ref >60)
Glucose, Bld: 126 mg/dL — ABNORMAL HIGH (ref 65–99)
Potassium: 3.3 mmol/L — ABNORMAL LOW (ref 3.5–5.1)
SODIUM: 139 mmol/L (ref 135–145)
Total Bilirubin: 0.4 mg/dL (ref 0.3–1.2)
Total Protein: 8.3 g/dL — ABNORMAL HIGH (ref 6.5–8.1)

## 2016-06-16 LAB — CBC
HEMATOCRIT: 49.1 % (ref 39.0–52.0)
Hemoglobin: 17.9 g/dL — ABNORMAL HIGH (ref 13.0–17.0)
MCH: 34.2 pg — ABNORMAL HIGH (ref 26.0–34.0)
MCHC: 36.5 g/dL — AB (ref 30.0–36.0)
MCV: 93.9 fL (ref 78.0–100.0)
Platelets: 179 10*3/uL (ref 150–400)
RBC: 5.23 MIL/uL (ref 4.22–5.81)
RDW: 13.7 % (ref 11.5–15.5)
WBC: 9.9 10*3/uL (ref 4.0–10.5)

## 2016-06-16 LAB — RAPID URINE DRUG SCREEN, HOSP PERFORMED
Amphetamines: NOT DETECTED
BARBITURATES: NOT DETECTED
Benzodiazepines: NOT DETECTED
Cocaine: POSITIVE — AB
Opiates: NOT DETECTED
Tetrahydrocannabinol: POSITIVE — AB

## 2016-06-16 LAB — URINALYSIS, ROUTINE W REFLEX MICROSCOPIC
BILIRUBIN URINE: NEGATIVE
Bacteria, UA: NONE SEEN
GLUCOSE, UA: NEGATIVE mg/dL
Hgb urine dipstick: NEGATIVE
KETONES UR: NEGATIVE mg/dL
LEUKOCYTES UA: NEGATIVE
Nitrite: NEGATIVE
PH: 5 (ref 5.0–8.0)
Protein, ur: NEGATIVE mg/dL
Specific Gravity, Urine: 1.012 (ref 1.005–1.030)
Squamous Epithelial / LPF: NONE SEEN

## 2016-06-16 LAB — ETHANOL: ALCOHOL ETHYL (B): 144 mg/dL — AB (ref <5)

## 2016-06-16 MED ORDER — HYDROCHLOROTHIAZIDE 12.5 MG PO CAPS
25.0000 mg | ORAL_CAPSULE | Freq: Once | ORAL | Status: AC
  Administered 2016-06-16: 25 mg via ORAL
  Filled 2016-06-16: qty 2

## 2016-06-16 MED ORDER — HYDROCHLOROTHIAZIDE 25 MG PO TABS: 25.0000 mg | ORAL_TABLET | Freq: Every day | ORAL | 0 refills | Status: AC

## 2016-06-16 MED ORDER — POTASSIUM CHLORIDE CRYS ER 20 MEQ PO TBCR
40.0000 meq | EXTENDED_RELEASE_TABLET | Freq: Once | ORAL | Status: AC
  Administered 2016-06-16: 40 meq via ORAL
  Filled 2016-06-16: qty 2

## 2016-06-16 MED ORDER — SODIUM CHLORIDE 0.9 % IV BOLUS (SEPSIS)
1000.0000 mL | Freq: Once | INTRAVENOUS | Status: AC
  Administered 2016-06-16: 1000 mL via INTRAVENOUS

## 2016-06-16 MED ORDER — IBUPROFEN 200 MG PO TABS
600.0000 mg | ORAL_TABLET | Freq: Once | ORAL | Status: AC
  Administered 2016-06-16: 600 mg via ORAL
  Filled 2016-06-16: qty 3

## 2016-06-16 MED ORDER — POTASSIUM CHLORIDE CRYS ER 20 MEQ PO TBCR: 40.0000 meq | EXTENDED_RELEASE_TABLET | Freq: Once | ORAL | Status: DC

## 2016-06-16 NOTE — ED Notes (Addendum)
Pt sts he spoke to someone at [place] and they said for him to come here first and they may be able to place him if someone will call at 9am.  Pt sts he is a polysubstance user and also sts he urinated blood today.

## 2016-06-16 NOTE — ED Notes (Signed)
Pt told me that he needed to leave or his wife would get fired because she has work and needs to pick him up. Pt removed own Iv and left despite urging him to wait for d/c instructions.

## 2016-06-16 NOTE — ED Triage Notes (Signed)
Pt states that he needs treatment for drugs (cocaine, heroin) and alcohol. Denies SI/HI. States that he needs medical clearance to go to [facility]. Alert and oriented.

## 2016-06-16 NOTE — Discharge Instructions (Signed)
Please go to [facility] for detox. Take blood pressure medicine as prescribed. Drink plenty of fluids. Return to the Ed if your symptoms worsen. Your urine shows no signs of infection.

## 2016-06-16 NOTE — ED Provider Notes (Signed)
[facility]-EMERGENCY DEPT Provider Note   CSN: [id number] Arrival date & time: 06/16/16  0025     History   Chief Complaint Chief Complaint  Patient presents with  . Drug / Alcohol Assessment  . Hematuria    HPI Spencer Greene is a 41 y.o. male.  HPI 41 year old African-American male past medical history significant for hypertension for compliance with blood pressure medication, alcohol abuse, polysubstance abuse presents to the ED today for medical clearance and hematuria. Patient is a poor historian. Patient states that he needs detox from alcohol, cocaine, heroin. Paste the way that "this stuff makes him feel". Has been using alcohol and drugs for many years. Patient spoke with someone at [facility] today who told him to come to the ED first for medical clearance. States they may have him this morning. Needs to be at the facility at 9 AM for possible placement. Patient also reports hematuria today. Says that his urine is "toxic". Has had poor by mouth intake. States that he has drank approximately 2 hours ago. States that he drinks approximately 1-2 gallons of alcohol a day unable to specify what type alcohol. Patient states that he used heroin and cocaine yesterday. Patient denies any other complaints including fever, chills, headache, vision changes, lightheadedness, dizziness, abdominal pain, nausea, emesis, change in bowel habits, back pain, paresthesias, hematocrit emesis, melena, hematochezia, penile discharge, testicular pain or swelling, dysuria, urgency, frequency. States he has not taken his pressure medication for the past 2 days. Denies any chest pain or shortness of breath. Past Medical History:  Diagnosis Date  . Hypertension     There are no active problems to display for this patient.   History reviewed. No pertinent surgical history.     Home Medications    Prior to Admission medications   Medication Sig Start Date End Date Taking? Authorizing Provider  famotidine (PEPCID)  20 MG tablet Take 1 tablet (20 mg total) by mouth 2 (two) times daily. Patient not taking: Reported on 12/25/2015 12/20/15   Jacalyn Lefevre, MD  hydrochlorothiazide (HYDRODIURIL) 25 MG tablet Take 1 tablet (25 mg total) by mouth daily. 06/16/16   Rise Mu, PA-C  ibuprofen (ADVIL,MOTRIN) 200 MG tablet Take 400 mg by mouth every 6 (six) hours as needed for pain (for headache.).    Historical Provider, MD  ondansetron (ZOFRAN ODT) 4 MG disintegrating tablet Take 1 tablet (4 mg total) by mouth every 8 (eight) hours as needed for nausea or vomiting. 12/25/15   Chase Picket Ward, PA-C    Family History History reviewed. No pertinent family history.  Social History Social History  Substance Use Topics  . Smoking status: Current Every Day Smoker    Packs/day: 1.50    Types: Cigarettes  . Smokeless tobacco: Never Used  . Alcohol use 7.2 oz/week    12 Cans of beer per week     Comment: 12 pack every other day     Allergies   Patient has no known allergies.   Review of Systems Review of Systems  Constitutional: Negative for chills and fever.  Eyes: Negative for visual disturbance.  Respiratory: Negative for cough and shortness of breath.   Cardiovascular: Negative for chest pain.  Gastrointestinal: Negative for abdominal pain, diarrhea, nausea and vomiting.  Genitourinary: Positive for hematuria. Negative for dysuria, flank pain, frequency, penile swelling, scrotal swelling, testicular pain and urgency.  Musculoskeletal: Negative for back pain.  Neurological: Negative for dizziness, syncope, weakness, light-headedness, numbness and headaches.     Physical Exam  Updated Vital Signs BP (!) 187/127 (BP Location: Left Arm)   Pulse 80   Temp 98.2 F (36.8 C) (Oral)   Resp 18   Ht  (1.727 m)   Wt 73.5 kg   SpO2 100%   BMI 24.63 kg/m   Physical Exam  Constitutional: He is oriented to person, place, and time. He appears well-developed and well-nourished. No distress.    Nontoxic appearing. Resting comfortably on the bed in no acute distress.  HENT:  Head: Normocephalic and atraumatic.  Mouth/Throat: Oropharynx is clear and moist.  Eyes: Conjunctivae and EOM are normal. Pupils are equal, round, and reactive to light. Right eye exhibits no discharge. Left eye exhibits no discharge. No scleral icterus.  Eyes are bloodshot.  Neck: Normal range of motion. Neck supple. No thyromegaly present.  Cardiovascular: Normal rate, regular rhythm, normal heart sounds and intact distal pulses.   Pulmonary/Chest: Effort normal and breath sounds normal.  Abdominal: Soft. Bowel sounds are normal. He exhibits no distension. There is no tenderness. There is no rebound and no guarding.  No CVA tenderness.  Musculoskeletal: Normal range of motion.  Lymphadenopathy:    He has no cervical adenopathy.  Neurological: He is alert and oriented to person, place, and time.  No tremor noted. Patient able to a malignant normal gait. Moving all 4 extremities without any difficulties. Speech is clear.  Skin: Skin is warm and dry. Capillary refill takes less than 2 seconds.  Nursing note and vitals reviewed.    ED Treatments / Results  Labs (all labs ordered are listed, but only abnormal results are displayed) Labs Reviewed  COMPREHENSIVE METABOLIC PANEL - Abnormal; Notable for the following:       Result Value   Potassium 3.3 (*)    Glucose, Bld 126 (*)    Total Protein 8.3 (*)    All other components within normal limits  ETHANOL - Abnormal; Notable for the following:    Alcohol, Ethyl (B) 144 (*)    All other components within normal limits  CBC - Abnormal; Notable for the following:    Hemoglobin 17.9 (*)    MCH 34.2 (*)    MCHC 36.5 (*)    All other components within normal limits  RAPID URINE DRUG SCREEN, HOSP PERFORMED - Abnormal; Notable for the following:    Cocaine POSITIVE (*)    Tetrahydrocannabinol POSITIVE (*)    All other components within normal limits   URINALYSIS, ROUTINE W REFLEX MICROSCOPIC    EKG  EKG Interpretation None       Radiology No results found.  Procedures Procedures (including critical care time)  Medications Ordered in ED Medications  sodium chloride 0.9 % bolus 1,000 mL (0 mLs Intravenous Stopped 06/16/16 0440)  potassium chloride SA (K-DUR,KLOR-CON) CR tablet 40 mEq (40 mEq Oral Given 06/16/16 0335)  ibuprofen (ADVIL,MOTRIN) tablet 600 mg (600 mg Oral Given 06/16/16 0336)  hydrochlorothiazide (MICROZIDE) capsule 25 mg (25 mg Oral Given 06/16/16 1610)     Initial Impression / Assessment and Plan / ED Course  I have reviewed the triage vital signs and the nursing notes.  Pertinent labs & imaging results that were available during my care of the patient were reviewed by me and considered in my medical decision making (see chart for details).     Patient presents to the ED with alcohol intoxication, polysubstance abuse, hematuria. Patient is asking for detox. Spoke with someone at [facility] for detox who said that he may have placement this a.m.  but needs medical clearance before placement. Patient with mild tachycardia in triage. Patient blood pressure was elevated. Has not taken blood pressure medicine for the past 2 days. Denies any associated symptoms including vision changes, headache, chest pain, shortness of breath. Urine shows no signs of hematuria or infection. Labs consistent with dehydration. UDS positive for cocaine and marijuana. Mild hypokalemia noted. We'll replace with oral potassium and recheck with primary care doctor. No leukocytosis noted. Alcohol 144. However patient is able to ambulate with normal gait. The patient is clinically sober. We'll give patient a dose of blood pressure medicine in the ED. Patient is asking for prescription for blood pressure medicine. Patient to follow-up with [facility] this a.m. for placement. No signs of alcohol withdrawal. Vital signs are stable. Tachycardia improved with fluids.  Pt is hemodynamically stable, in NAD, & able to ambulate in the ED. Pt has no complaints prior to dc. Pt is comfortable with above plan and is stable for discharge at this time. All questions were answered prior to disposition. Strict return precautions for f/u to the ED were discussed.   Final Clinical Impressions(s) / ED Diagnoses   Final diagnoses:  Hematuria, unspecified type  Alcoholic intoxication without complication [facility])  Polysubstance abuse    New Prescriptions Discharge Medication List as of 06/16/2016  4:17 AM       Rise Mu, PA-C 06/16/16 8119    Tilden Fossa, MD 06/21/16 [phone]

## 2017-10-14 ENCOUNTER — Encounter: Payer: Self-pay | Admitting: Emergency Medicine

## 2017-10-14 ENCOUNTER — Emergency Department
Admission: EM | Admit: 2017-10-14 | Discharge: 2017-10-14 | Disposition: A | Discharge status: Home / Self Care | Payer: Medicare (Managed Care) | Attending: Emergency Medicine | Admitting: Emergency Medicine

## 2017-10-14 ENCOUNTER — Other Ambulatory Visit: Payer: Self-pay

## 2017-10-14 DIAGNOSIS — Z79899 Other long term (current) drug therapy: Secondary | ICD-10-CM | POA: Insufficient documentation

## 2017-10-14 DIAGNOSIS — F1410 Cocaine abuse, uncomplicated: Secondary | ICD-10-CM | POA: Insufficient documentation

## 2017-10-14 DIAGNOSIS — L739 Follicular disorder, unspecified: Secondary | ICD-10-CM | POA: Insufficient documentation

## 2017-10-14 DIAGNOSIS — F17210 Nicotine dependence, cigarettes, uncomplicated: Secondary | ICD-10-CM | POA: Insufficient documentation

## 2017-10-14 DIAGNOSIS — F1210 Cannabis abuse, uncomplicated: Secondary | ICD-10-CM | POA: Insufficient documentation

## 2017-10-14 DIAGNOSIS — I10 Essential (primary) hypertension: Secondary | ICD-10-CM | POA: Insufficient documentation

## 2017-10-14 HISTORY — DX: Accidental discharge from unspecified firearms or gun, initial encounter: W34.00XA

## 2017-10-14 HISTORY — DX: Skin transplant status: Z94.5

## 2017-10-14 MED ORDER — SULFAMETHOXAZOLE-TRIMETHOPRIM 800-160 MG PO TABS
1.0000 | ORAL_TABLET | Freq: Once | ORAL | Status: AC
  Administered 2017-10-14: 1 via ORAL
  Filled 2017-10-14: qty 1

## 2017-10-14 MED ORDER — TRAMADOL HCL 50 MG PO TABS
50.0000 mg | ORAL_TABLET | Freq: Once | ORAL | Status: AC
  Administered 2017-10-14: 50 mg via ORAL
  Filled 2017-10-14: qty 1

## 2017-10-14 MED ORDER — SULFAMETHOXAZOLE-TRIMETHOPRIM 800-160 MG PO TABS: 1.0000 | ORAL_TABLET | Freq: Two times a day (BID) | ORAL | 0 refills | Status: AC

## 2017-10-14 MED ORDER — TRAMADOL HCL 50 MG PO TABS: 50.0000 mg | ORAL_TABLET | Freq: Four times a day (QID) | ORAL | 0 refills | Status: AC | PRN

## 2017-10-14 NOTE — ED Triage Notes (Signed)
Pt presents to ED c/o abscess to R testicle that started yesterday.

## 2017-10-14 NOTE — ED Notes (Signed)
Pt verbalizes understanding of discharge instructions.
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
## 2018-01-24 IMAGING — CR DG ELBOW COMPLETE 3+V*L*
4 series · 4 of 4 positions shown · non-contrast
Comparison: None.

CLINICAL DATA: LEFT elbow pain and swelling without injury. Suspect
olecranon bursitis.

EXAM:
LEFT ELBOW - COMPLETE 3+ VIEW

[elbow obl (1 of 2)]
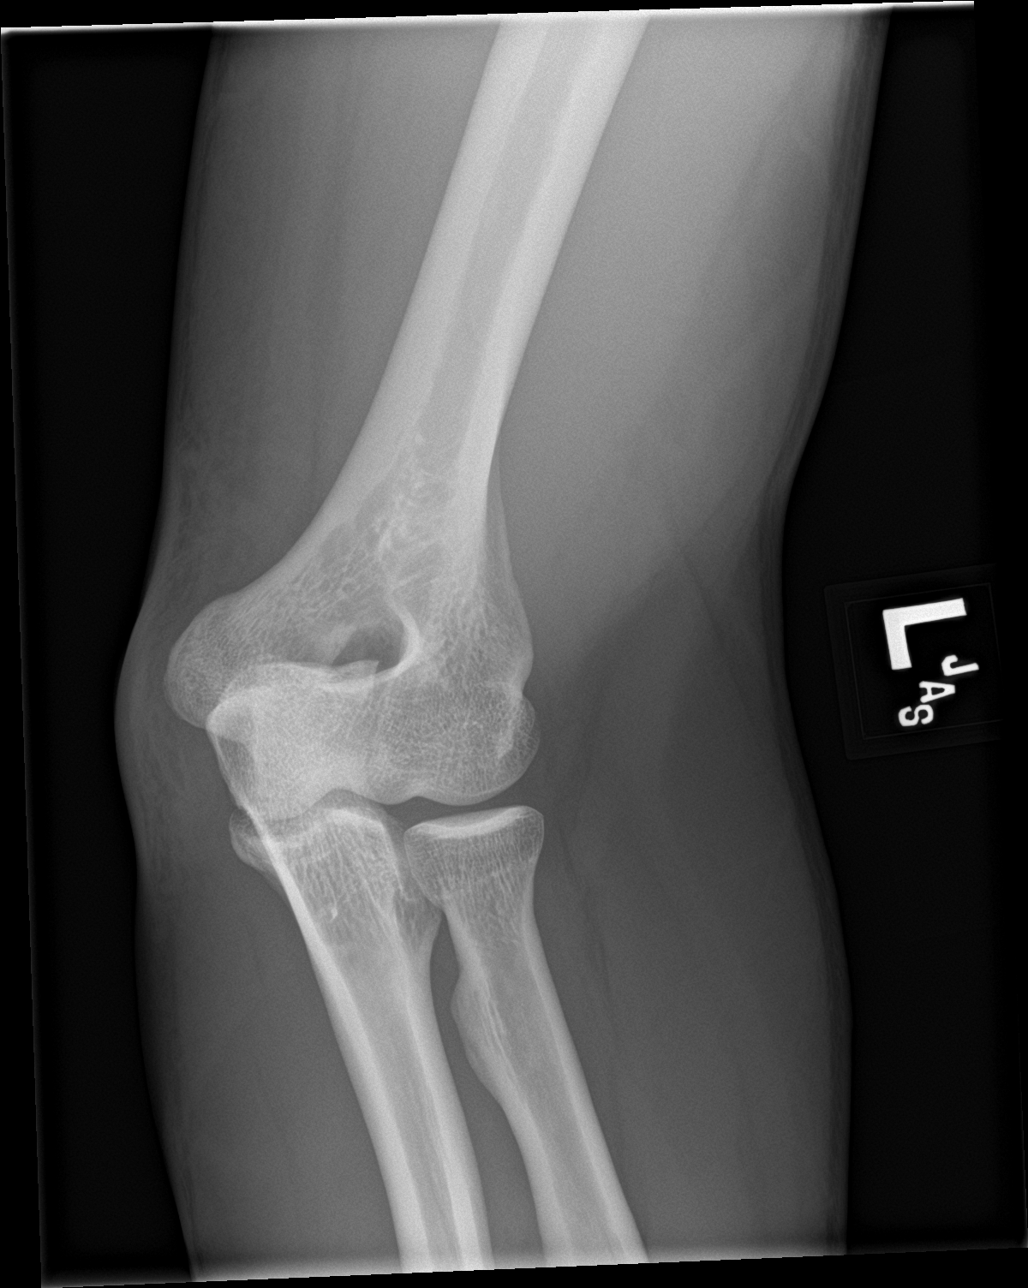

[elbow obl (2 of 2)]
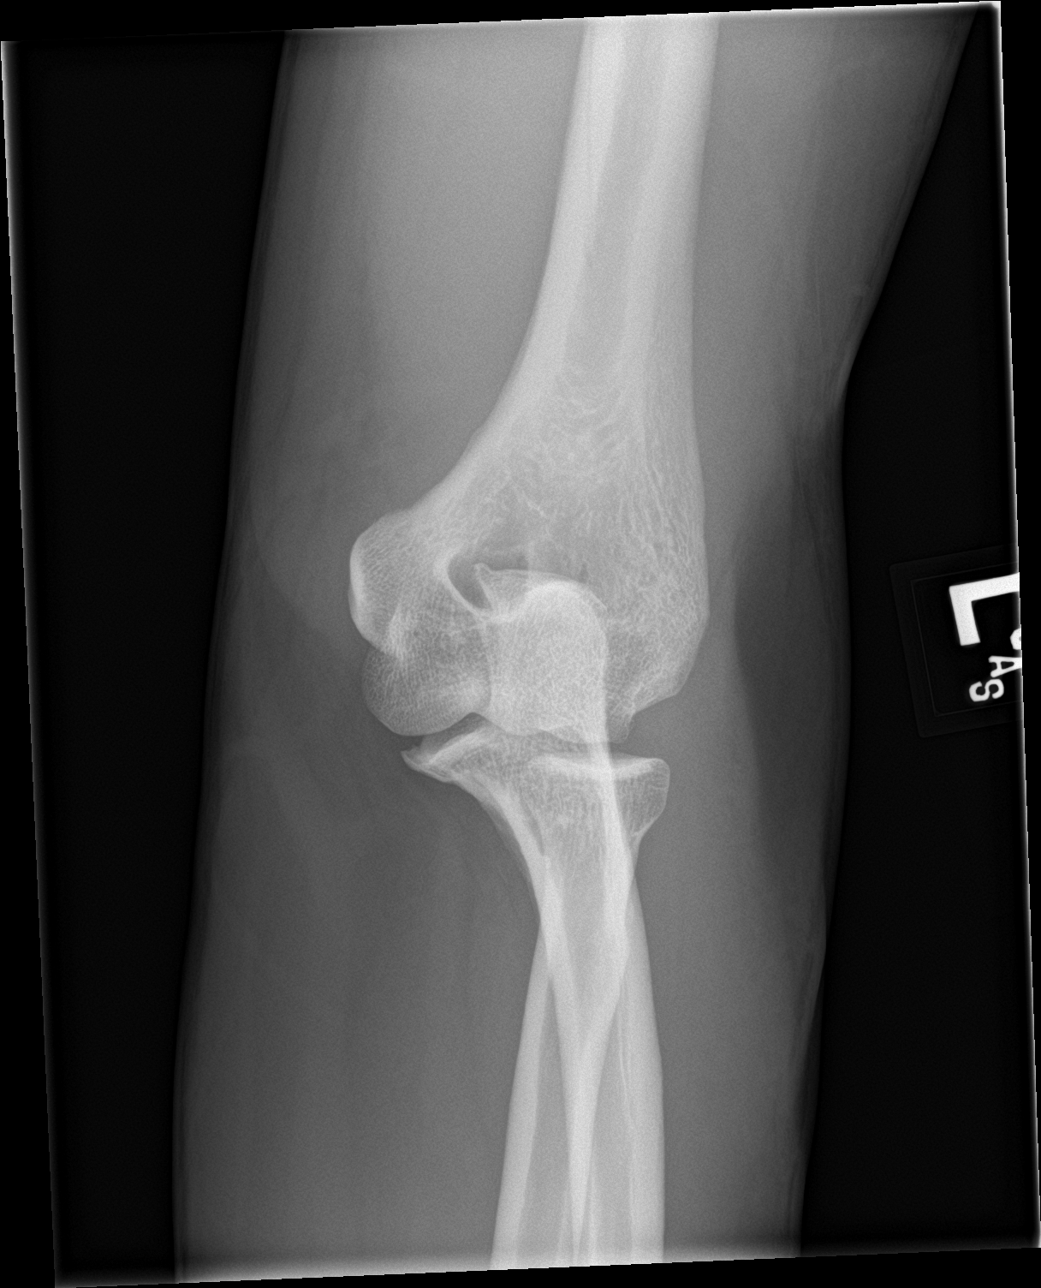

[elbow lat (1 of 2)]
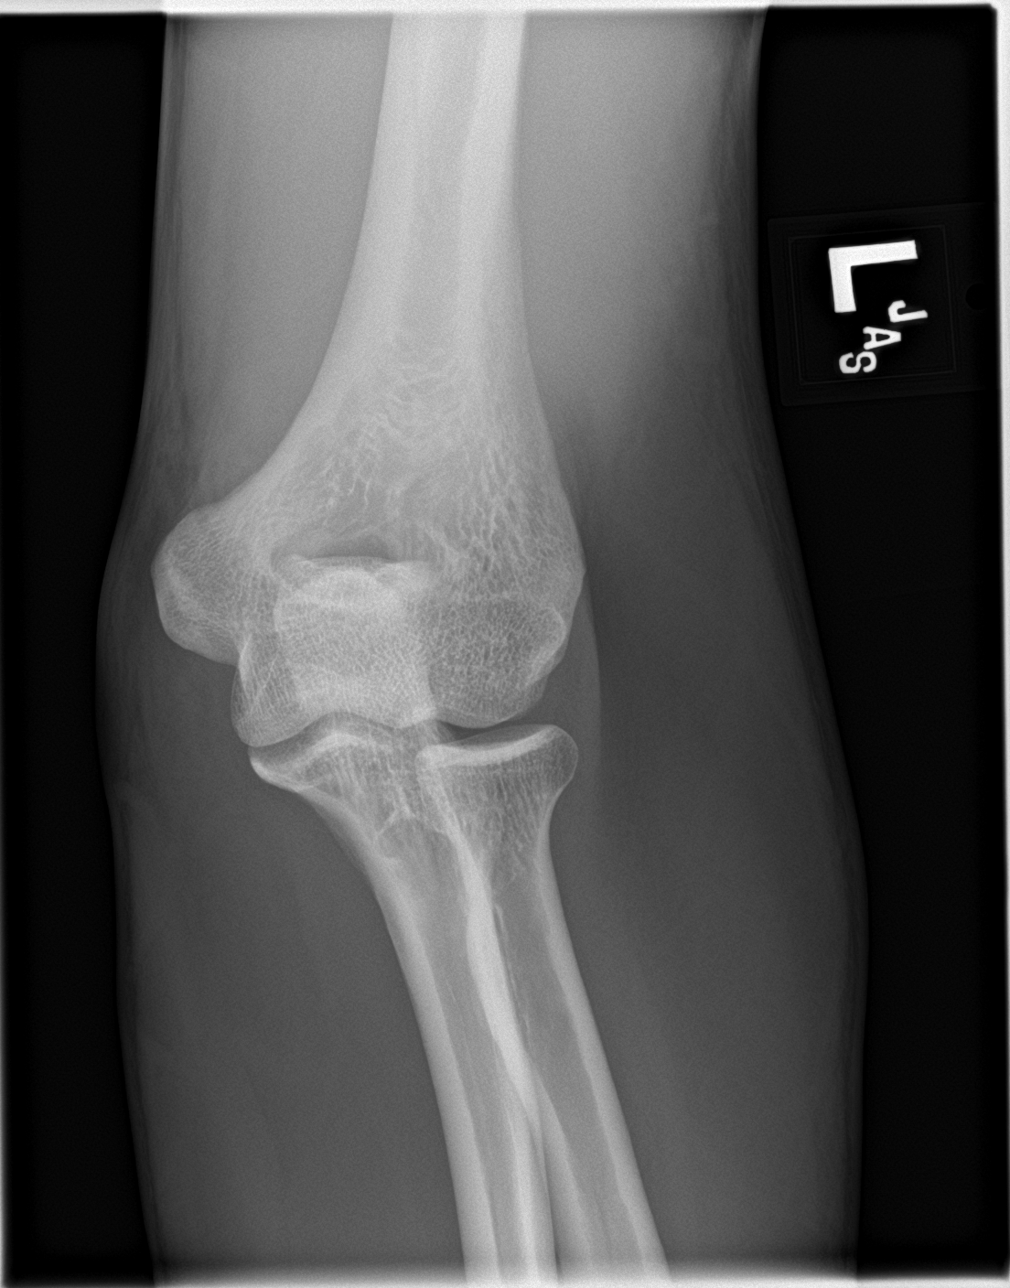

[elbow lat (2 of 2)]
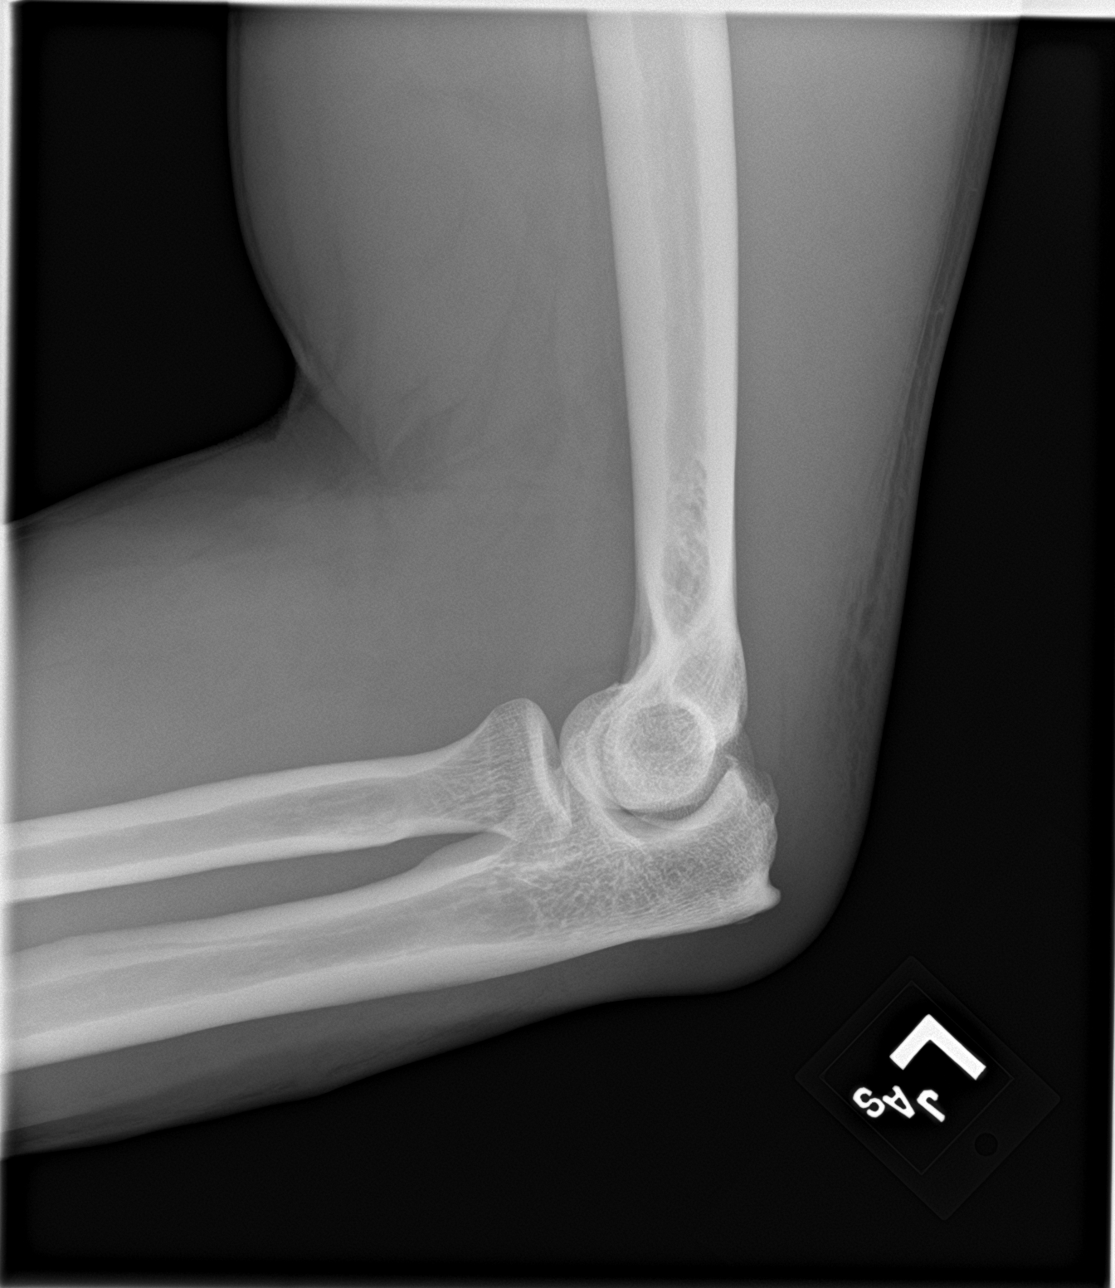

[4 of 4 positions shown; findings below may reference images not displayed]

FINDINGS: There is no evidence of fracture, dislocation, or joint effusion.
There is no evidence of arthropathy or other focal bone abnormality.
Minimal olecranon enthesopathy. Posterior elbow soft tissue swelling
without subcutaneous gas or radiopaque foreign bodies. No joint
effusion.
IMPRESSION: Posterior elbow soft tissue swelling without acute osseous process.

## 2021-09-02 ENCOUNTER — Encounter: Payer: Self-pay | Admitting: Gastroenterology

## 2021-09-21 ENCOUNTER — Ambulatory Visit: Payer: Medicaid (Managed Care) | Admitting: Family

## 2021-09-25 ENCOUNTER — Other Ambulatory Visit: Payer: Self-pay

## 2021-09-25 ENCOUNTER — Ambulatory Visit: Payer: Medicaid (Managed Care) | Attending: Psychiatry

## 2021-09-25 DIAGNOSIS — F331 Major depressive disorder, recurrent, moderate: Secondary | ICD-10-CM | POA: Insufficient documentation

## 2021-09-25 DIAGNOSIS — F1920 Other psychoactive substance dependence, uncomplicated: Secondary | ICD-10-CM | POA: Insufficient documentation

## 2021-09-25 DIAGNOSIS — F419 Anxiety disorder, unspecified: Secondary | ICD-10-CM | POA: Insufficient documentation

## 2021-09-25 NOTE — BH Intake Assessment (Signed)
_      Jeremy Orr  Mental health and Wellness  [place] - [facility] [phone]  [facility] - [facility]  [address]  [place] [address]                                       Intake Part 1    Patient Name: Jeremy Orr  Date of Birth:  11/08/1975  Age:  46 y.o.  Address:  [address]  [place] [address]    Provider: Clint Orr   Date of service:  09/25/2021  Start time of service: 11am  Duration of service:  45 minutes.  Type of service:  Initial Assessment (Intake Part 1)   Location of service: This program site; client was cleared on COVID screening questions regarding symptoms / exposure.     Others present for this service: none    IDENTIFYING INFORMATION:   Jeremy Orr is a 46 y.o. year old male who is Unknown and lives in a halfway house.  Jeremy Orr is currently unemployed.    REFERRAL SOURCE halfway house    CHIEF COMPLAINT:  Jeremy Orr  states: bi-polar, Ptsd, depression,     HISTORY OF PRESENT ILLNESS:     Current or Past Outpatient treatment[facility]     Current or [facility]stance [facility]   Current or past psychotropic medication treatment: client can not remember the medications    SUBSTANCE USE:  Recreational drugs: alcohol, marijuana, cocaine, narcotics, benzodiazepines and LSD  Use of alcohol: heavy  Use of caffeine: coffee 3 /day  Tobacco use: yes - vape/5 cigs a day  Legal consequences of chemical use: yes    CAGE-AID (ages 85 and up): Completed  CAGE Score: 4     RODS Rapid Opioid Use Screen:  Completed   Opioid Dependent:: 1 - Yes    CRAFFT Screen (ages 1-18): NA due to age        Based on the above screening, Jeremy Orr would benefit from further assessment and intervention for SUD.     MENTAL STATUS EXAM:  APPEARANCE: Appears stated age  ATTITUDE TOWARD INTERVIEWER: Cooperative  MOTOR ACTIVITY: WNL (within normal limits)  EYE CONTACT: Direct  SPEECH: Normal rate and tone  AFFECT: Neutral and Pleasant  MOOD: Neutral  THOUGHT PROCESS:  Normal  THOUGHT CONTENT: No unusual themes  PERCEPTION: Within normal limits  CURRENT SUICIDAL IDEATION: patient denies  CURRENT HOMICIDAL IDEATION: Patient denies  ORIENTATION: Alert and Oriented X 3.  CONCENTRATION: Fair  MEMORY:   Recent: intact   Remote: intact  JUDGMENT: Intact  IMPULSE CONTROL: Fair  INSIGHT: Fair    RISK ASSESSMENT :       [place] Suicide Severity Rating Scale (C-SSRS)      09/25/2021    11:18 AM   [place] Suicide Severity Rating Scale   [place] Suicide Severity Rating Scale Risk Calculation Negative Screen               Suicide:   Please indicate the suicide assessment that was completed today:  Due to negative screens, further assessment is not clinically indicated for this person at this time. Universal Precautions were taken, and screening will be conducted upon any concerns being noted or observed.     Additional risk factors:  Non-Suicidal Self-harm: none    Violence/Aggression: none  Substance use: yes,07/05/2021 ws last using day           CURRENT DIAGNOSIS:      ICD-10-CM ICD-9-CM   1. Drug abuse and dependence  F19.20 304.90   2. Recurrent moderate major depressive disorder with anxiety  F33.1 296.32    F41.9 300.00       STRENGTHS: Mental Health Provider, Culture and Hopeful     PLAN:  Patient will participate in continued assessment to assist with clinical decision making and treatment planning.     Signature:  Jeremy Orr

## 2021-09-30 ENCOUNTER — Ambulatory Visit: Payer: Medicaid (Managed Care) | Admitting: Psychiatry

## 2021-10-02 ENCOUNTER — Other Ambulatory Visit: Payer: Self-pay

## 2021-10-02 ENCOUNTER — Encounter: Payer: Self-pay | Admitting: Family

## 2021-10-02 ENCOUNTER — Ambulatory Visit: Payer: Medicaid (Managed Care) | Attending: Family | Admitting: Family

## 2021-10-02 VITALS — BP 130/80 | HR 88 | Temp 96.4°F | Resp 18 | Ht 67.0 in | Wt 189.0 lb

## 2021-10-02 DIAGNOSIS — F1911 Other psychoactive substance abuse, in remission: Secondary | ICD-10-CM | POA: Insufficient documentation

## 2021-10-02 DIAGNOSIS — Z811 Family history of alcohol abuse and dependence: Secondary | ICD-10-CM | POA: Insufficient documentation

## 2021-10-02 DIAGNOSIS — K219 Gastro-esophageal reflux disease without esophagitis: Secondary | ICD-10-CM | POA: Insufficient documentation

## 2021-10-02 DIAGNOSIS — F330 Major depressive disorder, recurrent, mild: Secondary | ICD-10-CM | POA: Insufficient documentation

## 2021-10-02 DIAGNOSIS — I10 Essential (primary) hypertension: Secondary | ICD-10-CM | POA: Insufficient documentation

## 2021-10-02 DIAGNOSIS — I714 Abdominal aortic aneurysm, without rupture, unspecified: Secondary | ICD-10-CM | POA: Insufficient documentation

## 2021-10-02 DIAGNOSIS — F419 Anxiety disorder, unspecified: Secondary | ICD-10-CM | POA: Insufficient documentation

## 2021-10-02 DIAGNOSIS — Z0000 Encounter for general adult medical examination without abnormal findings: Secondary | ICD-10-CM | POA: Insufficient documentation

## 2021-10-02 DIAGNOSIS — Z1211 Encounter for screening for malignant neoplasm of colon: Secondary | ICD-10-CM | POA: Insufficient documentation

## 2021-10-02 MED ORDER — AMLODIPINE BESYLATE 10 MG PO TABS *I*
10.0000 mg | ORAL_TABLET | Freq: Every day | ORAL | 1 refills | Status: DC
Start: 2021-10-02 — End: 2022-05-25

## 2021-10-02 MED ORDER — CLONIDINE HCL 0.2 MG PO TABS *I*
0.2000 mg | ORAL_TABLET | Freq: Two times a day (BID) | ORAL | 1 refills | Status: DC
Start: 2021-10-02 — End: 2021-12-28

## 2021-10-02 MED ORDER — HALOPERIDOL 0.5 MG PO TABS *I*
0.5000 mg | ORAL_TABLET | Freq: Every evening | ORAL | 1 refills | Status: AC
Start: 2021-10-02 — End: ?

## 2021-10-02 MED ORDER — ICY HOT ADVANCED PAIN RELIEF 16-11 % EX CREA
TOPICAL_CREAM | CUTANEOUS | 1 refills | Status: AC
Start: 2021-10-02 — End: ?

## 2021-10-02 MED ORDER — SALINE NASAL SPRAY 0.65 % NA SOLN *WRAPPED*
1.0000 | NASAL | 3 refills | Status: AC | PRN
Start: 2021-10-02 — End: ?

## 2021-10-02 MED ORDER — ALBUTEROL SULFATE HFA 108 (90 BASE) MCG/ACT IN AERS *I*
1.0000 | INHALATION_SPRAY | RESPIRATORY_TRACT | 6 refills | Status: AC | PRN
Start: 2021-10-02 — End: ?

## 2021-10-02 MED ORDER — OMEPRAZOLE 40 MG PO CPDR *I*
40.0000 mg | DELAYED_RELEASE_CAPSULE | Freq: Every day | ORAL | 1 refills | Status: AC
Start: 2021-10-02 — End: ?

## 2021-10-02 MED ORDER — FLUOXETINE HCL 40 MG PO CAPS *I*
40.0000 mg | ORAL_CAPSULE | Freq: Every day | ORAL | 1 refills | Status: AC
Start: 2021-10-02 — End: ?

## 2021-10-02 MED ORDER — DOCUSATE SODIUM 100 MG PO CAPS *I*
100.0000 mg | ORAL_CAPSULE | Freq: Three times a day (TID) | ORAL | 1 refills | Status: AC | PRN
Start: 2021-10-02 — End: ?
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
## 2021-10-02 NOTE — Patient Instructions (Signed)
Diagnoses and all orders for this visit:    Pression with anxiety, PTSD, schizophrenia: Continue  fluoxetine and Haldol as prescribed    Hypertension: Continue clonidine and amlodipine as prescribed    GERD: Continue omeprazole as prescribed    History of alcohol abuse: Continue Campral as prescribed    History of drug abuse: Continue Suboxone as prescribed    Abdominal aortic aneurysm: We will request latest ultrasound, will repeat in about 6 months as recommended    Please have fasting blood work (nothing other than water to eat or drink for 8-10 hours prior to blood draw) drawn at the [facility] out patient lab and clinic will contact you with results.

## 2021-10-06 ENCOUNTER — Telehealth: Payer: Self-pay
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
## 2021-10-14 ENCOUNTER — Ambulatory Visit: Payer: Medicaid (Managed Care) | Attending: Psychiatry

## 2021-10-14 ENCOUNTER — Other Ambulatory Visit: Payer: Self-pay

## 2021-10-14 DIAGNOSIS — F419 Anxiety disorder, unspecified: Secondary | ICD-10-CM | POA: Insufficient documentation

## 2021-10-14 DIAGNOSIS — F331 Major depressive disorder, recurrent, moderate: Secondary | ICD-10-CM | POA: Insufficient documentation
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
## 2021-10-26 DIAGNOSIS — F419 Anxiety disorder, unspecified: Secondary | ICD-10-CM

## 2021-10-26 NOTE — BH Discharge Summary (Signed)
_      [facility]  Mental health and Wellness  [address] [phone]  [facility] - [facility] AND WELLNESS  [address]  [place] [address]                                        DISCHARGE SUMMARY    PATIENT NAME: Jeremy Orr  DATE OF BIRTH:  October 10, 1975  AGE:  46 y.o.  ADDRESS:  [address]  [place] [address]    ADMISSION DATE: Intake 1  09/25/2021  DISCHARGE DATE: 10/26/2021  DISCHARGE DIAGNOSIS:    1. Recurrent moderate major depressive disorder with anxiety             REASON FOR DISCHARGE:   Danuel has sought treatment elsewhere / another provider           SUMMARY OF TREATMENT (interventions,progress, # of visits, response to medications):   Christo L Ohler is an 46 y.o. male who is Legally Separated .   Ronny participated with Individual Psychotherapy during treatment. Jahaan attended 1 appointments. Goals were  not attained. Factors for success / lack of success included: discharge     CONDITION AT TIME OF DISCHARGE (symptoms, functioning, unresolved problems, recommendations for future care): unknown    MEDICATION PLAN and REVIEW OF CURRENT MEDICATIONS:  Current Outpatient Medications   Medication Sig   ? acamprosate (CAMPRAL) 333 mg tablet Take 2 tablets (666 mg total) by mouth 3 times daily  Swallow whole. Do not crush, break, or chew.   ? buprenorphine-naloxone (SUBOXONE) 8-2 MG SL tablet Place 1 tablet under the tongue 2 times daily   ? albuterol HFA (PROVENTIL, VENTOLIN, PROAIR HFA) 108 (90 Base) MCG/ACT inhaler Inhale 1-2 puffs into the lungs every 4-6 hours as needed for Wheezing  Shake well before each use.   ? amLODIPine (NORVASC) 10 mg tablet Take 1 tablet (10 mg total) by mouth daily   ? cloNIDine (CATAPRES) 0.2 mg tablet Take 1 tablet (0.2 mg total) by mouth 2 times daily   ? docusate sodium (COLACE) 100 mg capsule Take 1 capsule (100 mg total) by mouth 3 times daily as needed   ? FLUoxetine (PROZAC) 40 mg capsule Take 1 capsule (40 mg total) by mouth daily   ?  haloperidol (HALDOL) 0.5 mg tablet Take 1 tablet (0.5 mg total) by mouth at bedtime   ? Menthol-Camphor (ICY HOT ADVANCED PAIN RELIEF) 16-11 % CREA Apply sparingly to affected areas.   ? omeprazole (PRILOSEC) 40 mg capsule Take 1 capsule (40 mg total) by mouth daily   ? sodium chloride (OCEAN) 0.65 % nasal spray Spray 1 spray into each nostril as needed for Congestion     No current facility-administered medications for this visit.        DISCHARGE ALERTS:  Suicide: unknown        Violence:  unknown    Psychosis: unknown    Substance Abuse:  unknown       IS FURTHER MENTAL HEALTH SERVICE, INCLUDING PSYCHOPHARMACOTHERAPY, RECOMMENDED AT THIS TIME?  No      GENERAL INSTRUCTIONS:    Contact a mental health counselor if symptoms return, National suicide prevention hotline, Local crisis resource: [place]: 211, Local crisis resource: [place]: [phone], Local Crisis resource: [place] 211     Signature:  Clint Guy, [facility]

## 2021-11-06 ENCOUNTER — Ambulatory Visit: Payer: Medicaid (Managed Care) | Attending: Surgery | Admitting: Surgery

## 2021-11-06 ENCOUNTER — Other Ambulatory Visit: Payer: Self-pay

## 2021-11-06 VITALS — BP 128/95 | HR 72 | Temp 98.9°F | Resp 20

## 2021-11-06 DIAGNOSIS — K047 Periapical abscess without sinus: Secondary | ICD-10-CM | POA: Insufficient documentation

## 2021-11-06 MED ORDER — AMOXICILLIN-POT CLAVULANATE 875-125 MG PO TABS *I*
1.0000 | ORAL_TABLET | Freq: Two times a day (BID) | ORAL | 0 refills | Status: AC
Start: 2021-11-06 — End: 2021-11-13

## 2021-11-06 MED ORDER — NAPROXEN 500 MG PO TABS *I*
500.0000 mg | ORAL_TABLET | Freq: Two times a day (BID) | ORAL | 0 refills | Status: DC | PRN
Start: 2021-11-06 — End: 2022-07-09
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
## 2021-11-06 NOTE — Patient Instructions (Signed)
-  You have been diagnosed today with a dental abscess  -Take antibiotics as prescribed until completion  -Tylenol as needed for pain and discomfort  -Naproxen twice daily with food for pain and discomfort  -Follow-up with your PCP and return if any further concern  -Go to your scheduled dentist appointment

## 2021-11-25 ENCOUNTER — Other Ambulatory Visit: Payer: Self-pay | Admitting: Surgery

## 2021-12-28 ENCOUNTER — Other Ambulatory Visit: Payer: Self-pay | Admitting: Family Medicine

## 2021-12-28 DIAGNOSIS — I10 Essential (primary) hypertension: Secondary | ICD-10-CM

## 2021-12-28 MED ORDER — CLONIDINE HCL 0.2 MG PO TABS *I*
0.2000 mg | ORAL_TABLET | Freq: Two times a day (BID) | ORAL | 3 refills | Status: DC
Start: 2021-12-28 — End: 2022-12-21

## 2021-12-28 NOTE — Telephone Encounter (Signed)
Last appointment: 10/02/2021  Next appointment:   Future Appointments   Date Time Provider Department Center   01/01/2022  9:40 AM Nightingale, Marnette Burgess, MD [facility] None

## 2022-01-01 ENCOUNTER — Ambulatory Visit: Payer: MEDICAID | Attending: Family Medicine | Admitting: Family Medicine

## 2022-01-01 ENCOUNTER — Other Ambulatory Visit: Payer: Self-pay

## 2022-01-01 ENCOUNTER — Telehealth: Payer: Self-pay | Admitting: Family Medicine

## 2022-01-01 VITALS — BP 110/70 | HR 88 | Temp 98.0°F | Resp 18 | Wt 190.6 lb

## 2022-01-01 DIAGNOSIS — Z13220 Encounter for screening for lipoid disorders: Secondary | ICD-10-CM | POA: Insufficient documentation

## 2022-01-01 DIAGNOSIS — Z20822 Contact with and (suspected) exposure to covid-19: Secondary | ICD-10-CM | POA: Insufficient documentation

## 2022-01-01 DIAGNOSIS — Z1152 Encounter for screening for COVID-19: Secondary | ICD-10-CM | POA: Insufficient documentation

## 2022-01-01 DIAGNOSIS — F330 Major depressive disorder, recurrent, mild: Secondary | ICD-10-CM | POA: Insufficient documentation

## 2022-01-01 DIAGNOSIS — I714 Abdominal aortic aneurysm, without rupture, unspecified: Secondary | ICD-10-CM

## 2022-01-01 DIAGNOSIS — F1011 Alcohol abuse, in remission: Secondary | ICD-10-CM | POA: Insufficient documentation

## 2022-01-01 DIAGNOSIS — F1911 Other psychoactive substance abuse, in remission: Secondary | ICD-10-CM | POA: Insufficient documentation

## 2022-01-01 DIAGNOSIS — J310 Chronic rhinitis: Secondary | ICD-10-CM | POA: Insufficient documentation

## 2022-01-01 DIAGNOSIS — K219 Gastro-esophageal reflux disease without esophagitis: Secondary | ICD-10-CM | POA: Insufficient documentation

## 2022-01-01 DIAGNOSIS — F419 Anxiety disorder, unspecified: Secondary | ICD-10-CM | POA: Insufficient documentation

## 2022-01-01 DIAGNOSIS — Z20828 Contact with and (suspected) exposure to other viral communicable diseases: Secondary | ICD-10-CM | POA: Insufficient documentation

## 2022-01-01 DIAGNOSIS — Z131 Encounter for screening for diabetes mellitus: Secondary | ICD-10-CM | POA: Insufficient documentation

## 2022-01-01 DIAGNOSIS — I10 Essential (primary) hypertension: Secondary | ICD-10-CM | POA: Insufficient documentation

## 2022-01-01 MED ORDER — LEVOCETIRIZINE DIHYDROCHLORIDE 5 MG PO TABS *I*
5.0000 mg | ORAL_TABLET | Freq: Every evening | ORAL | 0 refills | Status: AC
Start: 2022-01-01 — End: ?

## 2022-01-01 NOTE — Patient Instructions (Addendum)
Start flonase, albuterol, and the new allergy pill I sent  Please go to the lab to get blood drawn when able

## 2022-01-01 NOTE — Telephone Encounter (Signed)
Scheduled for 04/08/22

## 2022-01-01 NOTE — Progress Notes (Signed)
Progress Note    CC:   Chief Complaint   Patient presents with    Follow-up     3 month/cold x 3 days         Subjective:    This is a 46 y.o. male who presents to follow up medical conditions.     3 days of rhinitis and sneezing, has hx of seasonal allergies  Having watery eyes also  No fevers or chills   Has flonase which he hasn't used yet  Has psychiatrist through [facility] he is seeing    Hypertension: Prescription for amlodipine and clonidine  Depression with anxiety, PTSD, Schizophrenia per pt: prescription for fluoxetine, Haldol -prescribed by prior psychiatrist will refill for 30 to 60 days until new psychiatrist takes over prescribing  History of alcohol abuse: Prescription for Campral  History of drug abuse: Prescription for Suboxone  GERD: Scription for omeprazole, works well per pt      Allergies were reviewed and confirmed with patient.     Medications were reviewed and reconciled with the patient in eRecord today (see below).     Medication List:     Medication List            Accurate as of January 01, 2022 10:04 AM. If you have any questions, ask your nurse or doctor.                START taking these medications      levocetirizine 5 mg tablet  Commonly known as: XYZAL  Take 1 tablet (5 mg total) by mouth every evening  Started by: Rolanda Lundborg, MD            CONTINUE taking these medications      acamprosate 333 mg tablet  Commonly known as: CAMPRAL     albuterol HFA 108 (90 Base) MCG/ACT inhaler  Commonly known as: PROVENTIL, VENTOLIN, PROAIR HFA  Inhale 1-2 puffs into the lungs every 4-6 hours as needed for Wheezing  Shake well before each use.     amLODIPine 10 mg tablet  Commonly known as: NORVASC  Take 1 tablet (10 mg total) by mouth daily     buprenorphine-naloxone 8-2 MG SL tablet  Commonly known as: SUBOXONE     cloNIDine 0.2 mg tablet  Commonly known as: CATAPRES  Take 1 tablet (0.2 mg total) by mouth 2 times daily     docusate sodium 100 mg capsule  Commonly known as:  COLACE  Take 1 capsule (100 mg total) by mouth 3 times daily as needed     FLUoxetine 40 mg capsule  Commonly known as: PROzac  Take 1 capsule (40 mg total) by mouth daily     haloperidol 0.5 mg tablet  Commonly known as: HALDOL  Take 1 tablet (0.5 mg total) by mouth at bedtime     Icy Hot Advanced Pain Relief 16-11 % Crea  Generic drug: Menthol-Camphor  Apply sparingly to affected areas.     naproxen 500 mg tablet  Commonly known as: NAPROSYN  Take 1 tablet (500 mg total) by mouth 2 times daily as needed     omeprazole 40 mg capsule  Commonly known as: PriLOSEC  Take 1 capsule (40 mg total) by mouth daily     sodium chloride 0.65 % nasal spray  Commonly known as: OCEAN  Spray 1 spray into each nostril as needed for Congestion               Where to Get  Your Medications        These medications were sent to [facility] - [place], [place] - [address]  [address] [place] [address]      Phone: [phone]   levocetirizine 5 mg tablet           Objective:   Blood pressure 110/70, pulse 88, temperature 36.7 C (98 F), temperature source Temporal, resp. rate 18, weight 86.5 kg (190 lb 9.6 oz), SpO2 97 %.  Body mass index is 29.85 kg/m.       Gen: NAD, comfortable  Head: normocephalic, atraumatic  ENT: EACs clear, Tms pearly and flat  Nares clear  Oropharynx clear without lesions, erythema, or exudate  Eyes: EOMI  Pulm: no resp distress, good air entry, CTAB, no rales or wheezes  Cardio: RRR, no murmurs  Abdomen: soft, non-tender, non-distended  Vascular: radial pulses 2+ b/l  Msk: moving all extremities   Neuro: alert and oriented, no gross focal deficits  Psych: calm, cooperative, normal mood and affect  Skin: warm and dry, normal color, no rashes on exposed surfaces            No results for input(s): WBC, HGB, HCT, RBC, PLT in the last 8760 hours.    No results for input(s): NA, K, CL, CO2, UN, CREAT, GFRC, GFRB, GLU, CA, TP, ALB, ALT, AST, ALK, TB in the last 8760 hours.    No  results for input(s): CHOL, HDL, LDLC, LDL, TRIG, CHHDC in the last 8760 hours.    No components found with this basename: NHLDC  No results found for: HA1C  No results found for: TSH, T3, T4, THPER            Assessment/Plan:       Sartaj was seen today for follow-up.    Diagnoses and all orders for this visit:    Rhinitis, unspecified type  Runny nose, itchy and watery eyes, slight cough  Likely related to seasonal allergies   Will try flonase, xyzal, and albuterol PRN for wheezing and cough  Swab sent also  -     COVID/Influenza A & B/RSV NAAT (PCR); Future  -     COVID/Influenza A & B/RSV NAAT (PCR)  -     levocetirizine (XYZAL) 5 mg tablet; Take 1 tablet (5 mg total) by mouth every evening    Depression with anxiety, PTSD, schizophrenia: Continue  fluoxetine and Haldol as prescribed by psychiatrist through [facility]     Hypertension: Continue clonidine and amlodipine as prescribed     GERD: Continue omeprazole as prescribed     History of alcohol abuse: Continue Campral as prescribed     History of drug abuse: Continue Suboxone as prescribed     Abdominal aortic aneurysm: We will request latest ultrasound, will repeat in about 3 months as recommended      Follow up in about 6 months (around 07/02/2022) for Follow-up.    Rolanda Lundborg, MD on 01/01/2022 at 10:04 AM    N.B. Dictation software was used to help with documentation and while a reasonable effort was made to proofread, errors might still exist.

## 2022-01-03 LAB — COVID/INFLUENZA A & B/RSV NAAT (PCR)
COVID-19 NAAT (PCR): NEGATIVE
Influenza A NAAT (PCR): NEGATIVE
Influenza B NAAT (PCR): NEGATIVE
RSV NAAT (PCR): NEGATIVE

## 2022-01-04 ENCOUNTER — Other Ambulatory Visit
Admission: RE | Admit: 2022-01-04 | Discharge: 2022-01-04 | Disposition: A | Discharge status: Home / Self Care | Payer: MEDICAID | Source: Ambulatory Visit | Attending: Family | Admitting: Family

## 2022-01-04 ENCOUNTER — Telehealth: Payer: Self-pay | Admitting: Family Medicine

## 2022-01-04 DIAGNOSIS — Z13220 Encounter for screening for lipoid disorders: Secondary | ICD-10-CM | POA: Insufficient documentation

## 2022-01-04 DIAGNOSIS — I10 Essential (primary) hypertension: Secondary | ICD-10-CM | POA: Insufficient documentation

## 2022-01-04 DIAGNOSIS — Z0000 Encounter for general adult medical examination without abnormal findings: Secondary | ICD-10-CM | POA: Insufficient documentation

## 2022-01-04 DIAGNOSIS — Z131 Encounter for screening for diabetes mellitus: Secondary | ICD-10-CM | POA: Insufficient documentation

## 2022-01-04 LAB — CBC AND DIFFERENTIAL
Baso # K/uL: 0 10*3/uL (ref 0.0–0.2)
Basophil %: 1 %
Eos # K/uL: 0.1 10*3/uL (ref 0.0–0.5)
Eosinophil %: 3 %
Hematocrit: 44 % (ref 37–52)
Hemoglobin: 14.9 g/dL (ref 12.0–17.0)
Lymph # K/uL: 2.2 10*3/uL (ref 1.0–5.0)
Lymphocyte %: 37 %
MCH: 30 pg (ref 26–32)
MCHC: 34 g/dL (ref 32–37)
MCV: 87 fL (ref 75–100)
Mono # K/uL: 0.3 10*3/uL (ref 0.1–1.0)
Monocyte %: 8 %
Neut # K/uL: 1.5 10*3/uL (ref 1.5–6.5)
Nucl RBC # K/uL: 0 10*3/uL (ref 0.0–0.0)
Nucl RBC %: 0 /100 WBC (ref 0.0–0.2)
Platelets: 209 10*3/uL (ref 150–450)
RBC: 5 MIL/uL (ref 4.0–6.0)
RDW: 14.3 % (ref 0.0–15.0)
Seg Neut %: 35 %
WBC: 4.2 10*3/uL (ref 3.5–11.0)

## 2022-01-04 LAB — LIPID PANEL
Chol/HDL Ratio: 5.2
Cholesterol: 203 mg/dL — AB
HDL: 39 mg/dL — ABNORMAL LOW (ref 40–60)
LDL Calculated: 140 mg/dL — AB
Non HDL Cholesterol: 164 mg/dL
Triglycerides: 122 mg/dL

## 2022-01-04 LAB — COMPREHENSIVE METABOLIC PANEL
ALT: 13 U/L (ref 0–50)
AST: 14 U/L (ref 0–50)
Albumin: 4.2 g/dL (ref 3.5–5.2)
Alk Phos: 103 U/L (ref 40–130)
Anion Gap: 9 (ref 7–16)
Bilirubin,Total: 0.3 mg/dL (ref 0.0–1.2)
CO2: 31 mmol/L — ABNORMAL HIGH (ref 20–28)
Calcium: 9.2 mg/dL (ref 8.6–10.2)
Chloride: 100 mmol/L (ref 96–108)
Creatinine: 0.99 mg/dL (ref 0.67–1.17)
Glucose: 118 mg/dL — ABNORMAL HIGH (ref 60–99)
Lab: 11 mg/dL (ref 6–20)
Potassium: 4.1 mmol/L (ref 3.3–4.6)
Sodium: 140 mmol/L (ref 133–145)
Total Protein: 7.6 g/dL (ref 6.3–7.7)
eGFR BY CREAT: 94 *

## 2022-01-04 LAB — DIFF MANUAL
Diff Based On: 100 CELLS
React Lymph %: 16 % — ABNORMAL HIGH (ref 0–6)

## 2022-01-04 LAB — HEMOGLOBIN A1C: Hemoglobin A1C: 6.4 % — ABNORMAL HIGH

## 2022-01-04 LAB — TSH: TSH: 1.54 u[IU]/mL (ref 0.27–4.20)

## 2022-01-04 NOTE — Telephone Encounter (Signed)
Left message for patient to return call for results.

## 2022-01-05 ENCOUNTER — Telehealth: Payer: Self-pay | Admitting: Family Medicine

## 2022-01-05 DIAGNOSIS — E782 Mixed hyperlipidemia: Secondary | ICD-10-CM

## 2022-01-05 MED ORDER — ASPIRIN 81 MG PO TBEC *I*
81.0000 mg | DELAYED_RELEASE_TABLET | Freq: Every day | ORAL | 5 refills | Status: AC
Start: 2022-01-05 — End: 2022-07-04

## 2022-01-05 MED ORDER — ATORVASTATIN CALCIUM 20 MG PO TABS *I*
20.0000 mg | ORAL_TABLET | Freq: Every day | ORAL | 3 refills | Status: AC
Start: 2022-01-05 — End: 2023-01-05

## 2022-01-05 NOTE — Addendum Note (Signed)
Addended by: Racheal Patches on: 01/05/2022 11:02 AM     Modules accepted: Orders

## 2022-01-05 NOTE — Telephone Encounter (Signed)
Patient aware of results.

## 2022-01-05 NOTE — Telephone Encounter (Signed)
-----   Message from Evorn Gong, NP sent at 01/04/2022  4:04 PM EST -----  Please let patient know his cholesterol is elevated, I do recommend he start a statin and baby aspirin daily for treatment if willing will send to the pharmacy, as always - Decrease fried/fatty foods, red meats, cheeses and processed foods, increase physical activity as tolerated.

## 2022-01-05 NOTE — Telephone Encounter (Signed)
Heather from [facility] notified and he is willing to start stating and ASA 81 please send to [facility]

## 2022-01-05 NOTE — Telephone Encounter (Signed)
Left message to call back  

## 2022-01-14 ENCOUNTER — Telehealth: Payer: Self-pay

## 2022-01-18 ENCOUNTER — Telehealth: Payer: Self-pay

## 2022-01-18 NOTE — Telephone Encounter (Signed)
Procedure/Surgery scheduled for 03/17/22. Instructions given verbally and will be mailed.   They have been encouraged to call the office with any questions or concerns.  This will be scheduled with the OR.

## 2022-01-19 ENCOUNTER — Other Ambulatory Visit: Payer: Self-pay | Admitting: Surgery

## 2022-01-19 MED ORDER — BISACODYL EC 5 MG PO TBEC *I*
DELAYED_RELEASE_TABLET | ORAL | 0 refills | Status: DC
Start: 2022-01-19 — End: 2022-04-22

## 2022-01-19 MED ORDER — POLYETHYLENE GLYCOL 3350 PO POWD *I*
ORAL | 0 refills | Status: DC
Start: 2022-01-19 — End: 2022-04-22

## 2022-01-19 NOTE — Telephone Encounter (Signed)
[place] medicaid, no auth required

## 2022-01-27 ENCOUNTER — Other Ambulatory Visit: Payer: Self-pay | Admitting: Family Medicine

## 2022-01-27 NOTE — Telephone Encounter (Signed)
Please check medication list, per patient and Herbert Seta at [facility], [employer] prescribed him Folic Acid on his first visit to be established.  I do not see this on his med list.

## 2022-01-28 ENCOUNTER — Other Ambulatory Visit: Payer: Self-pay | Admitting: Family Medicine

## 2022-01-29 MED ORDER — FOLIC ACID 1 MG PO TABS *I*
1.0000 mg | ORAL_TABLET | Freq: Every day | ORAL | 3 refills | Status: DC
Start: 2022-01-29 — End: 2022-02-03

## 2022-02-03 MED ORDER — FOLIC ACID 1 MG PO TABS *I*
1.0000 mg | ORAL_TABLET | Freq: Every day | ORAL | 3 refills | Status: AC
Start: 2022-02-03 — End: ?

## 2022-02-03 NOTE — Telephone Encounter (Signed)
Called [facility], Need script sent.

## 2022-02-03 NOTE — Addendum Note (Signed)
Addended by: Marye Round on: 02/03/2022 11:43 AM     Modules accepted: Orders

## 2022-02-10 ENCOUNTER — Other Ambulatory Visit: Payer: Self-pay | Admitting: Family Medicine

## 2022-02-10 MED ORDER — FAMOTIDINE 20 MG PO TABS *I*
20.0000 mg | ORAL_TABLET | Freq: Every evening | ORAL | 3 refills | Status: DC | PRN
Start: 2022-02-10 — End: 2022-08-02

## 2022-02-10 NOTE — Telephone Encounter (Signed)
Requesting refill of his famotidine.20 mg qhs prn. Not sure why it is not on his list as it was on list from [facility]. Patient is out and has been eating Tums like candy.
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
## 2022-03-16 ENCOUNTER — Encounter: Payer: Self-pay | Admitting: Certified Registered"

## 2022-03-16 NOTE — Anesthesia Preprocedure Evaluation (Signed)
Anesthesia Pre-operative History and Physical for Jeremy Orr    Highlighted Issues for this Procedure:  47 y.o. male with Colon cancer screening [Z12.11] presenting for Procedure(s) (LRB):  COLONOSCOPY (N/A) by Surgeon(s):  Anabel Halon, MD scheduled for  minutes.      .  .  Anesthesia Evaluation Information Source: patient, records, surgeon handoff     ANESTHESIA HISTORY     Denies anesthesia history  Pertinent(-):  No History of anesthetic complications or Family hx of anesthetic complications    GENERAL    + Obesity  Pertinent (-):  No history of anesthetic complications or Family Hx of Anesthetic Complications    HEENT     Denies HEENT issues  Pertinent (-):  No glaucoma or visual impairment PULMONARY    + Smoker          tobacco currently, THC currently    + Asthma    CARDIOVASCULAR  Good(4+METs) Exercise Tolerance    + Hypertension  Pertinent(-):  No cardiac testing or hypotension    GI/HEPATIC/RENAL   NPO: > 8hrs ago (solids) and > 2hrs ago (clears)      + GERD    + Alcohol use          social    + Bowel Prep NEURO/PSYCH/ORTHO    + Chronic pain    + Neuropsychiatric Issues          depression    ENDO/OTHER     Denies endo issues  Pertinent(-):  No diabetes mellitus, thyroid disease, pituitary disease    HEMATOLOGIC     Denies hematologic issues  Pertinent(-):  No bruising/bleeding easily or arthritis       Physical Exam Not Completed________________________________________________________________________  PLAN  ASA Score  2  Anesthetic Plan MAC       Induction (routine IV) General Anesthesia/Sedation Maintenance Plan (propofol infusion and IV bolus); Line ( use current access); Monitoring (standard ASA); Positioning (supine); PONV Plan (dexamethasone and ondansetron); Pain (per surgical team); PostOp (PACU)Standard Attestation  Informed Consent     Risks:          Risks discussed were commensurate with the plan listed above with the following specific points: N/V, aspiration, dizziness, unsteadiness,  fatigue, sore throat, hypotension, headache, infection and emergence delirium, Damage to: eyes, nerves, teeth and blood vessels, allergic Rx, awareness, death and unexpected serious injury.    Anesthetic Consent:         Anesthetic plan (and risks as noted above) were discussed with patient    Plan also discussed with team members including:       CRNA and surgeon    Responsible Anesthesia Provider Attestation:  I attest that the patient or proxy understands and accepts the risks and benefits of the anesthesia plan. I also attest that I have personally performed a pre-anesthetic examination and evaluation, and prescribed the anesthetic plan for this particular location within 48 hours prior to the anesthetic as documented. Kieth Brightly, CRNA  03/16/22, 11:17 AM

## 2022-03-17 ENCOUNTER — Encounter
Admission: RE | Disposition: A | Discharge status: Home / Self Care | Payer: Self-pay | Source: Ambulatory Visit | Attending: Surgery

## 2022-03-17 ENCOUNTER — Ambulatory Visit
Admission: RE | Admit: 2022-03-17 | Discharge: 2022-03-17 | Disposition: A | Discharge status: Home / Self Care | Payer: MEDICAID | Source: Ambulatory Visit | Attending: Surgery | Admitting: Surgery

## 2022-03-17 SURGERY — COLONOSCOPY
Anesthesia: Monitor Anesthesia Care | Site: Abdomen

## 2022-03-18 ENCOUNTER — Telehealth: Payer: Self-pay

## 2022-04-07 NOTE — Telephone Encounter (Signed)
Procedure/Surgery scheduled for 04/27/22 with a 2 day bowel prep. Instructions given verbally and will be mailed.   This patient has been encouraged to call the office with any questions or concerns.  This will be scheduled with the OR.

## 2022-04-08 ENCOUNTER — Ambulatory Visit
Admission: RE | Admit: 2022-04-08 | Discharge: 2022-04-08 | Disposition: A | Discharge status: Home / Self Care | Payer: Medicaid (Managed Care) | Source: Ambulatory Visit | Attending: Family Medicine | Admitting: Family Medicine

## 2022-04-08 ENCOUNTER — Other Ambulatory Visit: Payer: Self-pay

## 2022-04-08 DIAGNOSIS — I714 Abdominal aortic aneurysm, without rupture, unspecified: Secondary | ICD-10-CM | POA: Insufficient documentation

## 2022-04-09 ENCOUNTER — Telehealth: Payer: Self-pay | Admitting: Family Medicine

## 2022-04-09 NOTE — Telephone Encounter (Addendum)
Spoke to individual at [facility]. They will have him return call for results.    ----- Message from Marnette Burgess Nightingale, MD sent at 04/08/2022  1:45 PM EST -----  Please inform patient no changes on the aorta ultrasound - is just borderline enlarged so nothing concerning - we could check it again in a year to keep an eye on it

## 2022-04-09 NOTE — Telephone Encounter (Signed)
Patient returned call and was given below message.

## 2022-04-21 ENCOUNTER — Telehealth: Payer: Self-pay

## 2022-04-21 DIAGNOSIS — Z1211 Encounter for screening for malignant neoplasm of colon: Secondary | ICD-10-CM

## 2022-04-22 MED ORDER — BISACODYL EC 5 MG PO TBEC *I*
DELAYED_RELEASE_TABLET | ORAL | 0 refills | Status: AC
Start: 2022-04-22 — End: ?

## 2022-04-22 MED ORDER — POLYETHYLENE GLYCOL 3350 PO POWD *I*
ORAL | 0 refills | Status: DC
Start: 2022-04-22 — End: 2022-05-28

## 2022-04-22 NOTE — Telephone Encounter (Signed)
This will be routed to our Provider to address. The patient is aware that the scripts may not be covered by his insurance, but we will try.

## 2022-04-22 NOTE — Telephone Encounter (Signed)
Prescriptions were sent per request to [facility] for the medications needed for his colonoscopy prep.

## 2022-04-26 NOTE — Invasive Procedure Plan of Care (Signed)
[facility]  OR SURGICAL PROCEDURE                            Patient Name: Jeremy Orr  [facility] [id number] MR                                                              DOB: [birth year]         Please read this form or have someone read it to you.   It's important to understand all parts of this form. If something isn't clear, ask [place] to explain.   When you sign it, that means you understand the form and give [place] permission to do this surgery or procedure.     I agree for Modesta Messing, MD , and None planned, occasionally a surgical resident is working with me under my supervision at all times.   along with any assistants* they may choose, to treat the following condition(s): Need for colonoscopy   By doing this surgery or procedure on me: Colonoscopy with biopsy and sedation with medications.     This is also known as: Colonoscopy with biopsy and sedation with medications.   Laterality: Not applicable     *if you'd like a list of the assistants, please ask. We can give that to you.    1. The care provider has explained my condition to me. They have told me how the procedure can help me. They have told me about other ways of treating my condition. I understand the care provider cannot guarantee the result of the procedure. If I don't have this procedure, my other choices are: Not having it done    2. The care provider has told me the risks (problems that can happen) of the procedure. I understand there may be unwanted results. The risks that are related to this procedure include: I discussed colonoscopy, also known as lower endoscopy, as well as sedation with medications.    Surgery and endoscopic procedures are not an exact science and every case has slight nuances if not major circumstances require a diversion from a routine procedure. This fact was explained.     Risks and benefits have been described and an opportunity for questions was given. Any questions that were asked by the patient and or  family members were appropriately addressed.     Major risks include bleeding or perforation. Both significant issues. Some of these may occur hours to days afterwards. This can be due to the procedure itself or biopsies, sampling of the tissues.    Complications often necessitate another surgical or interventional procedure.     In a most extreme example, some of these complications could lead to a major illness, major surgery, colostomy (bag for stool), with an extended intensive care unit stay and/or even death.    The patient is also aware that some of these complications may lead to more complex procedures and/or multiple future procedures and/or operations.     The potential for some of these complications always exists, and even a perfectly healthy patient may encounter complications.    Sedation is yet another risk, and of course all avenues of monitoring are taken to avoid problems.    Kinnie Feil, MD,  FACS  Chief, Division of [place]   [facility] for [facility] Professor of Clinical Surgery  [place]/[facility] [place] Office [phone]  [place] [phone]  12:51 PM 04/26/2022              3. I understand that during the procedure, my care provider may find a condition that we didn't know about before the treatment started. Therefore, I agree that my care provider can perform any other treatment which they think is necessary and available.    4. I give permission to the hospital and/or its departments to examine and keep tissue, blood, body parts, fluids or materials removed from my body during the procedure(s) to aid in diagnosis and treatment, after which they may be used for scientific research or teaching by appropriate persons. If these materials are used for science or teaching, my identity will be protected. I will no longer own or have any rights to these materials regardless of how they may be used.    5. My care  provider might want a representative from a [place] to be there during my procedure. I understand that person works for:          The ways they might help my care provider during my procedure include:            6. Here are my decisions about receiving blood, blood products, or tissues. I understand my decisions cover the time before, during and after my procedure, my treatment, and my time in the hospital. After my procedure, if my condition changes a lot, my care provider will talk with me again about receiving blood or blood products. At that time, my care provider might need me to review and sign another consent form, about getting or refusing blood.    I understand that the blood is from the community blood supply. Volunteers donated the blood, the volunteers were screened for health problems. The blood was examined with very sensitive and accurate tests to look for hepatitis, HIV/AIDS, and other diseases. Before I receive blood, it is tested again to make sure it is the correct type.    My chances of getting a sickness from blood products are small. But no transfusion is 100% safe. I understand that my care provider feels the good I will receive from the blood is greater than the chances of something going wrong. My care provider has answered my questions about blood products.    Deep Sedation:  Deep sedation and/or general anesthesia will be used during the procedure. Your anesthesiologist will discuss the risks and benefits with you.       My decision  about blood or  blood products   Yes, I agree to receive blood or blood products if my care provider thinks they're needed.  none      My decision   about tissue  Implants     Not applicable.          I understand this  form.    My care provider  or his/her  assistants have  explained:   What I am having done and why I need it.  What other choices I can make instead of having this done.  The benefits and possible risks (problems) to me of having  this done.  The benefits and possible risks (problems) to me of receiving transplants, blood, or blood products.  There is no guarantee of the  results.  The care provider may not stay with me the entire time that I am in the operating or procedure room.  My provider has explained how this may affect my procedure. My provider has answered my  questions about this.         I give my  permission for  this surgery or  procedure.            _______________________________________________                                     My signature  (or parent or other person authorized to sign for you, if you are unable to sign for  yourself or if you are under 12 years old)        ______           Date        _____        Time   Electronic Signatures will display at the bottom of the consent form.    Care provider's statement: I have discussed the planned procedure, including the possibility for transfusion of blood  products or receipt of tissue as necessary; expected benefits; the possible complications and risks; and possible alternatives  and their benefits and risks with the patients or the patient's surrogate. In my opinion, the patient or the patient's surrogate  understands the proposed procedure, its risks, benefits and alternatives.              Electronically signed by: Kinnie Feil, MD                                                04/26/2022         Date        12:51 PM        Time   Your doctor or someone your doctor has appointed has told you that you may need blood or a blood product transfusion, which has been collected from volunteers, as part of your treatment as a patient.    The reasons you might need blood or blood products include, but are not limited to:   Significant loss of your own blood  Your body may not be getting enough oxygen to its tissues   Treatment of bleeding disorders caused by low platelet counts or platelets that do not work right (platelets are part of a cell that helps to form clots and  keeps you from bleeding too much).  You may not have enough of other substances that help your blood clot or stop you from bleeding more  The risks of getting a transfusion of blood or blood products include, but are not limited to:   Damage to the lungs  Difficulty breathing due to fluid in the lungs  The product may contain bacteria or rarely a virus (which includes HIV and Hepatitis)  Blood from the community blood supply has been collected from volunteer donors who have been screened for health risk. The blood has been tested for major blood transmitted disease, but no transfusion is 100% safe. The blood is tested with very sensitive and accurate tests to screen for hepatitis, AIDS, and other disease, which makes the risks very small.  You may have side effects from the transfusion (rash,  fever, chills) or an allergic reaction  The transfusion increases your risks of getting infection or cancer coming back  The transfusion can increase the time you have to stay in the hospital  The transfusion can potentially cause death if the wrong blood is given or your body rejects the blood  Before blood is transfused, it is tested again to make sure it is the correct type  There are other options than getting blood or blood products from other people and they include:   Drugs which can decrease bleeding  Drugs which can cause your body to make more blood (used in elective procedures with advance notice)  Autologous (your own blood) donation (needs pre-arrangement)  No transfusion  If you exercise your right to refuse to be transfused with blood or blood products; these things listed below, among others, could happen to you:  Your body may not get enough oxygen and suffer damage  You may have a higher chance of bleeding  You may limit other options for your condition  You may die from losing too much blood

## 2022-04-26 NOTE — Anesthesia Preprocedure Evaluation (Signed)
Anesthesia Pre-operative History and Physical for Jeremy Orr    Highlighted Issues for this Procedure:  47 y.o. male with Colon cancer screening [Z12.11] presenting for Procedure(s) (LRB):  COLONOSCOPY- previos procedure incomplete- 2 day bowel prep this tim (N/A) by Surgeon(s):  Modesta Messing, MD scheduled for  minutes.      .  .  Anesthesia Evaluation Information Source: patient, records, surgeon handoff     ANESTHESIA HISTORY     Denies anesthesia history  Pertinent(-):  No History of anesthetic complications or Family hx of anesthetic complications    GENERAL     Denies general issues  Pertinent (-):  No obesity, infection, history of anesthetic complications or Family Hx of Anesthetic Complications    HEENT     Denies HEENT issues  Pertinent (-):  No glaucoma, visual impairment or hearing loss PULMONARY    + Asthma          mild intermittent  Pertinent(-):  No smoking or COPD    CARDIOVASCULAR  Good(4+METs) Exercise Tolerance    + Hypertension          well controlled    GI/HEPATIC/RENAL   NPO: > 2hrs ago (clears) and > 8hrs ago (solids)      + GERD          well controlled    + Bowel Prep NEURO/PSYCH/ORTHO    + Chronic pain    + Neuropsychiatric Issues          anxiety, depression  Pertinent(-):  No dementia, delirium or intellectual disability    ENDO/OTHER     Denies endo issues    + Diabetes Mellitus          Type 2 no insulin  Pertinent(-):  No thyroid disease, pituitary disease    HEMATOLOGIC     Denies hematologic issues  Pertinent(-):  No bruising/bleeding easily or arthritis       Physical Exam Not Completed________________________________________________________________________  PLAN  ASA Score  3  Anesthetic Plan MAC       Induction (routine IV) General Anesthesia/Sedation Maintenance Plan (propofol infusion and IV bolus); Line ( use current access); Monitoring (standard ASA); Positioning (supine); PONV Plan (dexamethasone and ondansetron); Pain (per surgical team); PostOp  (PACU)Standard Attestation  Informed Consent     Risks:          Risks discussed were commensurate with the plan listed above with the following specific points: N/V, aspiration, dizziness, unsteadiness, fatigue, sore throat, hypotension, headache, infection and emergence delirium, Damage to: eyes, nerves, teeth and blood vessels, allergic Rx, awareness, death and unexpected serious injury.    Anesthetic Consent:         Anesthetic plan (and risks as noted above) were discussed with patient    Plan also discussed with team members including:       CRNA and surgeon    Responsible Anesthesia Provider Attestation:  I attest that the patient or proxy understands and accepts the risks and benefits of the anesthesia plan. I also attest that I have personally performed a pre-anesthetic examination and evaluation, and prescribed the anesthetic plan for this particular location within 48 hours prior to the anesthetic as documented. Arnette Felts, CRNA  04/26/22, 10:46 AM

## 2022-04-27 ENCOUNTER — Encounter: Payer: Self-pay | Admitting: Certified Registered"

## 2022-04-27 ENCOUNTER — Ambulatory Visit
Admission: RE | Admit: 2022-04-27 | Discharge: 2022-04-27 | Disposition: A | Discharge status: Home / Self Care | Payer: Medicaid (Managed Care) | Source: Ambulatory Visit | Attending: Surgery | Admitting: Surgery

## 2022-04-27 ENCOUNTER — Encounter
Admission: RE | Disposition: A | Discharge status: Home / Self Care | Payer: Self-pay | Source: Ambulatory Visit | Attending: Surgery

## 2022-04-27 ENCOUNTER — Telehealth: Payer: Self-pay

## 2022-04-27 SURGERY — COLONOSCOPY
Anesthesia: Monitor Anesthesia Care | Site: Abdomen | Wound class: Clean Contaminated

## 2022-04-27 SURGICAL SUPPLY — 14 items
BRUSH CLEANING ENDOSCOPE COMBO DISP (Supply) ×1
BRUSH CLNG ENDOSCOPE 220MM FOE 2- 4.2MM CH PLSTC STIFF DISP (Supply) ×1 IMPLANT
CATHETER ADAPTER LUER TIP (Catheter) ×1 IMPLANT
FORCEPS DISPOSABLE ALLIGATOR JAW W/NEEDLE (Supply) ×1 IMPLANT
GLOVE SURG PROTEXIS PI 7.0 PF SYN (Glove) ×1 IMPLANT
LUBRICANT JELLY 4OZ STERILE (Supply) ×1 IMPLANT
PAD GROUNDING ADULT SURE FIT (Supply) ×1 IMPLANT
SNARE ELECTROSURGICAL 15MM X .40MM DISP (Supply) ×1 IMPLANT
SOL SODIUM CHLORIDE IRRIG 1000ML BTL (Solution) ×1 IMPLANT
SOL WATER IRRIG STERILE 1000ML BTL (Solution) ×1 IMPLANT
SPONGE ENZYMATIC REVITAL-OX (Sponge) ×1 IMPLANT
SYRINGE LUERLOCK 50 ML INDIVIDUAL WRAP (Syringe) ×1 IMPLANT
TRAP POLYP W/ 2 SPEC TY CLR MAGNIFYING WIND ETRAP (Supply) ×1 IMPLANT
TUBING SUCT 10FT X 1/4IN (Tubing) ×1 IMPLANT

## 2022-04-27 NOTE — Telephone Encounter (Signed)
Colonoscopy moved to 07/14/22.  This will be rescheduled with the OR.  This patient was encouraged to call the office with any questions or concerns.

## 2022-05-03 ENCOUNTER — Telehealth: Payer: Self-pay | Admitting: Family Medicine

## 2022-05-03 DIAGNOSIS — R7611 Nonspecific reaction to tuberculin skin test without active tuberculosis: Secondary | ICD-10-CM

## 2022-05-03 NOTE — Telephone Encounter (Signed)
Can you order this?

## 2022-05-03 NOTE — Telephone Encounter (Signed)
Heather from [employer] states patient has False positive PPD's and she is wondering if he can get a CXR.  Please let her know [phone]

## 2022-05-24 ENCOUNTER — Ambulatory Visit: Payer: Medicaid (Managed Care)

## 2022-05-25 ENCOUNTER — Ambulatory Visit: Payer: Medicaid (Managed Care) | Attending: Family Medicine

## 2022-05-25 ENCOUNTER — Other Ambulatory Visit: Payer: Self-pay

## 2022-05-25 ENCOUNTER — Other Ambulatory Visit: Payer: Self-pay | Admitting: Family Medicine

## 2022-05-25 ENCOUNTER — Ambulatory Visit: Payer: Medicaid (Managed Care)

## 2022-05-25 DIAGNOSIS — I10 Essential (primary) hypertension: Secondary | ICD-10-CM

## 2022-05-25 DIAGNOSIS — Z111 Encounter for screening for respiratory tuberculosis: Secondary | ICD-10-CM | POA: Insufficient documentation

## 2022-05-25 MED ORDER — AMLODIPINE BESYLATE 10 MG PO TABS *I*
10.0000 mg | ORAL_TABLET | Freq: Every day | ORAL | 1 refills | Status: DC
Start: 2022-05-25 — End: 2022-09-13

## 2022-05-25 NOTE — Telephone Encounter (Signed)
Last appointment: 01/01/2022  Next appointment:   Future Appointments   Date Time Provider Department Center   07/09/2022  9:00 AM Britta Mccreedy, NP [facility] None

## 2022-05-25 NOTE — Progress Notes (Signed)
Patient presents to office for ppd placement, as lives in group home. After receiving verbal consent from patient/ and signed consent, along with order from provider ppd placed left forearm. Patient will have RN at group home read and fax to [place] on Thursday.

## 2022-05-27 ENCOUNTER — Telehealth: Payer: Self-pay | Admitting: Family Medicine

## 2022-05-27 DIAGNOSIS — R7611 Nonspecific reaction to tuberculin skin test without active tuberculosis: Secondary | ICD-10-CM

## 2022-05-27 NOTE — Telephone Encounter (Signed)
Received a call from Clydie Braun at [facility]. Patient had a PPD earlier this week. It was read and the patient was positive with an 18 mm area. Patient pad this happen before and just had to get a chest xray. They are wondering if you want patient to just have the xray or if you want him to come in for appointment. Call patient at [phone] and let him know.

## 2022-05-27 NOTE — Telephone Encounter (Signed)
Pt aware.

## 2022-05-28 ENCOUNTER — Other Ambulatory Visit: Payer: Self-pay

## 2022-05-28 ENCOUNTER — Emergency Department
Admission: EM | Admit: 2022-05-28 | Discharge: 2022-05-28 | Disposition: A | Discharge status: Home / Self Care | Payer: Medicaid (Managed Care) | Source: Ambulatory Visit | Attending: Emergency Medicine | Admitting: Emergency Medicine

## 2022-05-28 DIAGNOSIS — K625 Hemorrhage of anus and rectum: Secondary | ICD-10-CM | POA: Insufficient documentation

## 2022-05-28 DIAGNOSIS — K5903 Drug induced constipation: Secondary | ICD-10-CM | POA: Insufficient documentation

## 2022-05-28 MED ORDER — POLYETHYLENE GLYCOL 3350 PO POWD *I*
17.0000 g | Freq: Every day | ORAL | 2 refills | Status: AC
Start: 2022-05-28 — End: ?
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
## 2022-05-28 NOTE — ED Triage Notes (Signed)
Patient states that he takes suboxone and it makes him constipated, had to stain to have bowel movement about two hours ago and noticed that there was blood on the stool and had the toilet paper when he wiped.

## 2022-05-28 NOTE — Discharge Instructions (Addendum)
Jeremy Orr you are seen in the ER today for rectal bleeding and physical.  I believe this is likely secondary to constipation and straining.  Possible anal fissure.  There is no apparent evidence of hemorrhoids on my exam.  I recommend constipation treatment with MiraLAX, and increase fiber intake.  Follow-up with the primary care provider for reassessment further management.  Return to the ER if you start experiencing continuous bleeding, if you start experiencing dizziness lightheadedness.

## 2022-06-01 ENCOUNTER — Other Ambulatory Visit: Payer: Self-pay

## 2022-06-01 ENCOUNTER — Ambulatory Visit
Admission: RE | Admit: 2022-06-01 | Discharge: 2022-06-01 | Disposition: A | Discharge status: Home / Self Care | Payer: Medicaid (Managed Care) | Source: Ambulatory Visit | Attending: Family Medicine | Admitting: Family Medicine

## 2022-06-01 DIAGNOSIS — R7611 Nonspecific reaction to tuberculin skin test without active tuberculosis: Secondary | ICD-10-CM | POA: Insufficient documentation

## 2022-06-04 ENCOUNTER — Encounter: Payer: Self-pay | Admitting: Surgery

## 2022-06-06 ENCOUNTER — Other Ambulatory Visit: Payer: Self-pay

## 2022-06-06 ENCOUNTER — Emergency Department
Admission: EM | Admit: 2022-06-06 | Discharge: 2022-06-06 | Disposition: A | Discharge status: Home / Self Care | Payer: Medicaid (Managed Care) | Source: Ambulatory Visit | Attending: Emergency Medicine | Admitting: Emergency Medicine

## 2022-06-06 ENCOUNTER — Encounter: Payer: Self-pay | Admitting: Emergency Medicine

## 2022-06-06 DIAGNOSIS — R9431 Abnormal electrocardiogram [ECG] [EKG]: Secondary | ICD-10-CM

## 2022-06-06 DIAGNOSIS — I440 Atrioventricular block, first degree: Secondary | ICD-10-CM

## 2022-06-06 DIAGNOSIS — K029 Dental caries, unspecified: Secondary | ICD-10-CM | POA: Insufficient documentation

## 2022-06-06 DIAGNOSIS — R000 Tachycardia, unspecified: Secondary | ICD-10-CM | POA: Insufficient documentation

## 2022-06-06 DIAGNOSIS — K047 Periapical abscess without sinus: Secondary | ICD-10-CM | POA: Insufficient documentation

## 2022-06-06 DIAGNOSIS — I517 Cardiomegaly: Secondary | ICD-10-CM

## 2022-06-06 LAB — DIFF MANUAL
Diff Based On: 100 CELLS
React Lymph %: 4 % (ref 0–6)

## 2022-06-06 LAB — BASIC METABOLIC PANEL
Anion Gap: 13 (ref 7–16)
CO2: 24 mmol/L (ref 20–28)
Calcium: 8.9 mg/dL (ref 8.6–10.2)
Chloride: 97 mmol/L (ref 96–108)
Creatinine: 0.88 mg/dL (ref 0.67–1.17)
Glucose: 150 mg/dL — ABNORMAL HIGH (ref 60–99)
Lab: 8 mg/dL (ref 6–20)
Potassium: 3.5 mmol/L (ref 3.3–4.6)
Sodium: 134 mmol/L (ref 133–145)
eGFR BY CREAT: 106 *

## 2022-06-06 LAB — CBC AND DIFFERENTIAL
Baso # K/uL: 0 10*3/uL (ref 0.0–0.2)
Eos # K/uL: 0.5 10*3/uL (ref 0.0–0.5)
Hematocrit: 50 % (ref 37–52)
Hemoglobin: 16.9 g/dL (ref 12.0–17.0)
Lymph # K/uL: 3.3 10*3/uL (ref 1.0–5.0)
MCV: 85 fL (ref 75–100)
Mono # K/uL: 0.3 10*3/uL (ref 0.1–1.0)
Neut # K/uL: 2.4 10*3/uL (ref 1.5–6.5)
Nucl RBC # K/uL: 0 10*3/uL (ref 0.0–0.0)
Nucl RBC %: 0 /100 WBC (ref 0.0–0.2)
Platelets: 243 10*3/uL (ref 150–450)
RBC: 5.9 MIL/uL (ref 4.0–6.0)
RDW: 14.9 % (ref 0.0–15.0)
Seg Neut %: 38 %
WBC: 6.4 10*3/uL (ref 3.5–11.0)

## 2022-06-06 MED ORDER — SODIUM CHLORIDE 0.9 % IV BOLUS *I*
1000.0000 mL | Freq: Once | Status: AC
Start: 2022-06-06 — End: 2022-06-06
  Administered 2022-06-06: 1000 mL via INTRAVENOUS

## 2022-06-06 MED ORDER — PENICILLIN V POTASSIUM 500 MG PO TABS *I*
500.0000 mg | ORAL_TABLET | Freq: Four times a day (QID) | ORAL | 0 refills | Status: AC
Start: 2022-06-06 — End: 2022-06-11

## 2022-06-06 MED ORDER — KETOROLAC TROMETHAMINE 30 MG/ML IJ SOLN *I*
15.0000 mg | Freq: Once | INTRAMUSCULAR | Status: AC
Start: 2022-06-06 — End: 2022-06-06
  Administered 2022-06-06: 15 mg via INTRAVENOUS
  Filled 2022-06-06: qty 1

## 2022-06-06 MED ORDER — PENICILLIN V POTASSIUM 500 MG PO TABS *I*
500.0000 mg | ORAL_TABLET | Freq: Once | ORAL | Status: AC
Start: 2022-06-06 — End: 2022-06-06
  Administered 2022-06-06: 500 mg via ORAL
  Filled 2022-06-06: qty 1

## 2022-06-06 NOTE — Discharge Instructions (Signed)
You were seen today for a dental infection. There are no concerning findings on blood tests or EKG today.     While in the Emergency Department you received IV fluids, IV medication for pain, and antibiotics.    Make sure to get lots of rest, drink plenty of healthy fluids, and eat a nutritious balanced diet. You may take ibuprofen or tylenol over-the-counter as needed for pain, use as directed on the label.     Call your dentist to make a follow-up appointment as soon as possible.    If any symptoms worsen or new symptoms develop that are concerning to you, call your doctor or return to the Emergency Department.

## 2022-06-06 NOTE — ED Triage Notes (Signed)
Patient presents with upper left dental pain x3 days.  He has been in touch with his dentist but is unable to get in this weekend.     Prehospital medications given: No
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
## 2022-06-08 ENCOUNTER — Other Ambulatory Visit: Payer: Self-pay

## 2022-06-08 ENCOUNTER — Ambulatory Visit: Payer: Medicaid (Managed Care) | Attending: Family Medicine

## 2022-06-08 ENCOUNTER — Other Ambulatory Visit: Payer: Self-pay | Admitting: Family Medicine

## 2022-06-08 VITALS — BP 148/104

## 2022-06-08 DIAGNOSIS — I10 Essential (primary) hypertension: Secondary | ICD-10-CM | POA: Insufficient documentation

## 2022-06-08 MED ORDER — LISINOPRIL 10 MG PO TABS *I*
10.0000 mg | ORAL_TABLET | Freq: Every day | ORAL | 1 refills | Status: DC
Start: 2022-06-08 — End: 2022-06-25

## 2022-06-08 NOTE — Progress Notes (Signed)
Patient presents to office today for a nurse visit, BP check per provider. Patient was in the sitting position, adult cuff and left arm. Patient tolerated well. BP reading was 166/88, MD aware. Patient sat for 15 minutes, repeat BP was 148/104. MD aware Patient will follow up with MD at next appointment.

## 2022-06-18 ENCOUNTER — Ambulatory Visit: Payer: Medicaid (Managed Care) | Attending: Family Medicine

## 2022-06-18 ENCOUNTER — Other Ambulatory Visit: Payer: Self-pay

## 2022-06-18 VITALS — BP 142/86

## 2022-06-18 DIAGNOSIS — I10 Essential (primary) hypertension: Secondary | ICD-10-CM | POA: Insufficient documentation

## 2022-06-18 NOTE — Progress Notes (Signed)
Patient presents to office today for a nurse visit, BP check per provider. Patient was in the sitting position, adult cuff and left arm. Patient tolerated well. BP reading was 142/86, NP aware. Changes made to medication.

## 2022-06-25 ENCOUNTER — Telehealth: Payer: Self-pay | Admitting: Family Medicine

## 2022-06-25 DIAGNOSIS — I10 Essential (primary) hypertension: Secondary | ICD-10-CM

## 2022-06-25 MED ORDER — LISINOPRIL 20 MG PO TABS *I*
20.0000 mg | ORAL_TABLET | Freq: Every day | ORAL | 1 refills | Status: DC
Start: 2022-06-25 — End: 2022-06-29

## 2022-06-25 NOTE — Telephone Encounter (Signed)
Spoke with you about patient's BP check. Mentioned sending in another medication as it is still running high and patient has been having daily headaches from high BP.

## 2022-06-25 NOTE — Telephone Encounter (Signed)
Patient called stating he was just in recently for BP check and was told that a new medication was to be sent in for him but the pharmacy states they don't have anything. I don't see anything in patient's chart. Patient can be reached at [phone]

## 2022-06-29 ENCOUNTER — Telehealth: Payer: Self-pay | Admitting: Family Medicine

## 2022-06-29 DIAGNOSIS — I10 Essential (primary) hypertension: Secondary | ICD-10-CM

## 2022-06-29 MED ORDER — LISINOPRIL 20 MG PO TABS *I*
20.0000 mg | ORAL_TABLET | Freq: Every day | ORAL | 1 refills | Status: DC
Start: 2022-06-29 — End: 2022-09-13

## 2022-06-29 NOTE — Telephone Encounter (Signed)
Last appt: 01/01/2022  Future appt:   Future Appointments   Date Time Provider Department Center   07/02/2022  9:40 AM NURSE, [facility] [facility] None   07/09/2022  9:00 AM Britta Mccreedy, NP [facility] None

## 2022-06-29 NOTE — Telephone Encounter (Signed)
Received a call from [facility] and the lisinopril 20 mg need to be resent by a provider that is contracted with medicaid. Please resend.

## 2022-07-02 ENCOUNTER — Ambulatory Visit: Payer: Medicaid (Managed Care)

## 2022-07-09 ENCOUNTER — Telehealth: Payer: Self-pay | Admitting: Family Medicine

## 2022-07-09 ENCOUNTER — Other Ambulatory Visit
Admission: RE | Admit: 2022-07-09 | Discharge: 2022-07-09 | Disposition: A | Discharge status: Home / Self Care | Payer: Medicaid (Managed Care) | Source: Ambulatory Visit | Attending: Family Medicine | Admitting: Family Medicine

## 2022-07-09 ENCOUNTER — Encounter: Payer: Self-pay | Admitting: Anesthesiology

## 2022-07-09 ENCOUNTER — Telehealth: Payer: Self-pay

## 2022-07-09 ENCOUNTER — Encounter: Payer: Self-pay | Admitting: Family Medicine

## 2022-07-09 ENCOUNTER — Ambulatory Visit: Payer: MEDICAID | Admitting: Family Medicine

## 2022-07-09 ENCOUNTER — Other Ambulatory Visit: Payer: Self-pay

## 2022-07-09 ENCOUNTER — Ambulatory Visit: Payer: Medicaid (Managed Care) | Attending: Family Medicine | Admitting: Family Medicine

## 2022-07-09 VITALS — BP 136/76 | HR 75 | Temp 98.6°F | Resp 19 | Wt 201.0 lb

## 2022-07-09 DIAGNOSIS — F1011 Alcohol abuse, in remission: Secondary | ICD-10-CM | POA: Insufficient documentation

## 2022-07-09 DIAGNOSIS — R7303 Prediabetes: Secondary | ICD-10-CM | POA: Insufficient documentation

## 2022-07-09 DIAGNOSIS — I10 Essential (primary) hypertension: Secondary | ICD-10-CM | POA: Insufficient documentation

## 2022-07-09 DIAGNOSIS — K219 Gastro-esophageal reflux disease without esophagitis: Secondary | ICD-10-CM | POA: Insufficient documentation

## 2022-07-09 DIAGNOSIS — Z1211 Encounter for screening for malignant neoplasm of colon: Secondary | ICD-10-CM | POA: Insufficient documentation

## 2022-07-09 DIAGNOSIS — I714 Abdominal aortic aneurysm, without rupture, unspecified: Secondary | ICD-10-CM | POA: Insufficient documentation

## 2022-07-09 DIAGNOSIS — F1911 Other psychoactive substance abuse, in remission: Secondary | ICD-10-CM | POA: Insufficient documentation

## 2022-07-09 DIAGNOSIS — R351 Nocturia: Secondary | ICD-10-CM | POA: Insufficient documentation

## 2022-07-09 DIAGNOSIS — E782 Mixed hyperlipidemia: Secondary | ICD-10-CM | POA: Insufficient documentation

## 2022-07-09 LAB — COMPREHENSIVE METABOLIC PANEL
ALT: 23 U/L (ref 0–50)
AST: 43 U/L (ref 0–50)
Albumin: 4.2 g/dL (ref 3.5–5.2)
Alk Phos: 83 U/L (ref 40–130)
Anion Gap: 11 (ref 7–16)
Bilirubin,Total: <0.2 mg/dL (ref 0.0–1.2)
CO2: 28 mmol/L (ref 20–28)
Calcium: 9 mg/dL (ref 8.6–10.2)
Chloride: 101 mmol/L (ref 96–108)
Creatinine: 0.83 mg/dL (ref 0.67–1.17)
Glucose: 105 mg/dL — ABNORMAL HIGH (ref 60–99)
Lab: 9 mg/dL (ref 6–20)
Potassium: 4.2 mmol/L (ref 3.3–4.6)
Sodium: 140 mmol/L (ref 133–145)
Total Protein: 7.4 g/dL (ref 6.3–7.7)
eGFR BY CREAT: 108 *

## 2022-07-09 LAB — MICROALBUMIN, URINE, RANDOM
Creatinine,UR: 427 mg/dL — ABNORMAL HIGH (ref 20–300)
Microalb/Creat Ratio: 4.9 mg MA/g CR (ref 0.0–29.9)
Microalbumin,UR: 2.08 mg/dL

## 2022-07-09 LAB — LIPID PANEL
Chol/HDL Ratio: 5.8
Cholesterol: 181 mg/dL
HDL: 31 mg/dL — ABNORMAL LOW (ref 40–60)
LDL Calculated: 121 mg/dL
Non HDL Cholesterol: 150 mg/dL
Triglycerides: 144 mg/dL

## 2022-07-09 LAB — PSA (EFF.4-2010): PSA (eff. 4-2010): 0.89 ng/mL (ref 0.00–4.00)

## 2022-07-09 LAB — HEMOGLOBIN A1C: Hemoglobin A1C: 6.4 % — ABNORMAL HIGH

## 2022-07-09 NOTE — Telephone Encounter (Signed)
Patient forgot to ask You if he can do the Cologuard instead of a Colonoscopy, he does not want to be put under.

## 2022-07-09 NOTE — Telephone Encounter (Signed)
Patient wants to cancel his Colonoscopy appointment, he states he does not want to be put under, so a cologuard has been ordered by Verta Ellen, NP.

## 2022-07-09 NOTE — Anesthesia Preprocedure Evaluation (Addendum)
Anesthesia Pre-operative History and Physical for Jeremy Orr    Highlighted Issues for this Procedure:  47 y.o. male with Colon cancer screening [Z12.11] presenting for Procedure(s) (LRB):  COLONOSCOPY (N/A) by Surgeon(s):  Hewitt Shorts, MD scheduled for 32 minutes.  BMI Readings from Last 1 Encounters:  06/06/22 : 29.76 kg/m            Electrophysiology/AICD/Pacer:  4/24  Narrative  - ABNORMAL ECG -  Impression  Sinus tachycardia  Borderline  prolonged PR interval  Probable  left atrial enlargement  Borderline  right axis deviation  Borderline  T wave abnormalities      .  Marland Kitchen  Anesthesia Evaluation Information Source: patient, records, surgeon handoff     ANESTHESIA HISTORY     Denies anesthesia history  Pertinent(-):  No History of anesthetic complications or Family hx of anesthetic complications    GENERAL    + Substance abuse          Suboxone [place]  Pertinent (-):  No obesity, infection, history of anesthetic complications or Family Hx of Anesthetic Complications    HEENT     Denies HEENT issues  Pertinent (-):  No glaucoma, visual impairment or hearing loss PULMONARY    + Asthma          mild intermittent  Pertinent(-):  No smoking or COPD    CARDIOVASCULAR  Good(4+METs) Exercise Tolerance    + Hypertension          well controlled    GI/HEPATIC/RENAL   NPO: > 2hrs ago (clears) and > 8hrs ago (solids)      + GERD          well controlled    + Bowel Prep NEURO/PSYCH/ORTHO    + Chronic pain    + Neuropsychiatric Issues          anxiety, depression, schizophrenia  Pertinent(-):  No dementia, delirium or intellectual disability    ENDO/OTHER     Denies endo issues    + Diabetes Mellitus          Type 2 no insulin  Pertinent(-):  No thyroid disease, pituitary disease    HEMATOLOGIC     Denies hematologic issues  Pertinent(-):  No bruising/bleeding easily or arthritis         Physical Exam Not Completed________________________________________________________________________  PLAN  ASA Score   3  Anesthetic Plan MAC       Induction (routine IV) General Anesthesia/Sedation Maintenance Plan (propofol infusion); Airway (mask); Line ( use current access); Monitoring (standard ASA); Positioning (supine); Pain (per surgical team); PostOp (PACU)Standard Attestation    Informed Consent     Responsible Anesthesia Provider Attestation:  I attest that the patient or proxy understands and accepts the risks and benefits of the anesthesia plan. I also attest that I have personally performed a pre-anesthetic examination and evaluation, and prescribed the anesthetic plan for this particular location within 48 hours prior to the anesthetic as documented. Nichollas Perusse, MD  07/09/22, 8:34 AM

## 2022-07-09 NOTE — Telephone Encounter (Signed)
A staff message was received from Jeremy Orr at the [facility].  This patient does not want to have the colonoscopy.  He does not want any type of sedation.  Dr. Noelle Penner has ordered a Cologuard test.  The colonoscopy will be canceled with the OR.

## 2022-07-09 NOTE — Patient Instructions (Signed)
I would recommend that you reimplement the MiraLAX into your daily routine.  Complete the blood work as soon as possible so that we can evaluate for diabetes as we discussed your prediabetic status at length.  Lets follow-up in 3 months to see how you are doing on this.  If you have progressed to full-blown diabetes I will be in contact with you or my nurse team will be in contact with you with further recommendations.

## 2022-07-09 NOTE — Progress Notes (Signed)
[facility] - [facility]  [address] [place], [address]  [place] [address]  Phone: [phone]  Fax: [phone]     Patient ID: Jeremy Orr is a 46 y.o. male.    Chief Complaint   Patient presents with    Follow-up     Jeremy Orr is a 47 year old male who presents to [facility] for 62-month follow-up regarding chronic conditions.  He was last evaluated in office by his PCP for chronic conditions on 01/01/2022.  He endorses that at times he can be urinating quite frequently in the evening time although not during the day.  This is bothersome as he can wake up multiple times in the evening due to such and would like to have his prostate evaluated through blood work.  He denies consumption of caffeine, excessive consumption of fluids prior to bed, use of alcohol.    Essential hypertension: Continues on lisinopril 20 mg and amlodipine 10 mg.  Does have prescription for clonidine 0.2 mg twice daily.    Mixed hyperlipidemia: He continues on atorvastatin 20 mg daily, no issues with such    Prediabetes: Most recent hemoglobin A1c of 6.4 on 01/04/2022.  Patient states that he was now aware of his prediabetic status and has not been following a antidiabetic diet or engaging in a routine formal exercise program.    AAA without rupture: Recently completed ultrasound of aorta on 04/08/2022 which yielded no changes, borderline enlargement.  Recheck in 1 year.    GERD: Continues to utilize omeprazole 40 mg, working well without symptomology.    MDD with anxiety, PTSD, schizophrenia: He has established with a new psychiatrist at [facility] to his prescribing his medications.    Current smoker: Currently smoking between 2 and 4 cigarettes each day.    History of drug abuse: Now utilizing buprenorphine injection    History of alcohol abuse: Reports that he is no longer on Campral since beginning his buprenorphine injection.                      Medications Reviewed and changes   Current Outpatient  Medications   Medication Sig    amoxicillin (AMOXIL) 500 mg capsule Take 1 capsule (500 mg total) by mouth 4 times daily.    lisinopril (PRINIVIL,ZESTRIL) 20 mg tablet Take 1 tablet (20 mg total) by mouth daily for High Blood Pressure Disorder.    bisacodyl (DULCOLAX) 5 MG EC tablet Take as directed by the bowel prep protocol for colonoscopy    famotidine 20 mg tablet Take 1 tablet (20 mg total) by mouth nightly as needed for Heartburn    atorvastatin (LIPITOR) 20 mg tablet Take 1 tablet (20 mg total) by mouth daily    levocetirizine (XYZAL) 5 mg tablet Take 1 tablet (5 mg total) by mouth every evening    cloNIDine (CATAPRES) 0.2 mg tablet Take 1 tablet (0.2 mg total) by mouth 2 times daily    buprenorphine-naloxone (SUBOXONE) 8-2 MG SL tablet Place 1 tablet under the tongue 2 times daily.    FLUoxetine (PROZAC) 40 mg capsule Take 1 capsule (40 mg total) by mouth daily    haloperidol (HALDOL) 0.5 mg tablet Take 1 tablet (0.5 mg total) by mouth at bedtime    omeprazole (PRILOSEC) 40 mg capsule Take 1 capsule (40 mg total) by mouth daily    polyethylene glycol (GLYCOLAX) powder Take 17 g by mouth daily Mix in 8 oz water or juice and  drink. (Patient not taking: Reported on 07/09/2022)    amLODIPine (NORVASC) 10 mg tablet Take 1 tablet (10 mg total) by mouth daily    folic acid (FOLVITE) 1 mg tablet Take 1 tablet (1 mg total) by mouth daily    naproxen (NAPROSYN) 500 mg tablet Take 1 tablet (500 mg total) by mouth 2 times daily as needed (Patient not taking: Reported on 07/09/2022)    acamprosate (CAMPRAL) 333 mg tablet Take 2 tablets (666 mg total) by mouth 3 times daily Swallow whole. Do not crush, break, or chew.    albuterol HFA (PROVENTIL, VENTOLIN, PROAIR HFA) 108 (90 Base) MCG/ACT inhaler Inhale 1-2 puffs into the lungs every 4-6 hours as needed for Wheezing  Shake well before each use.    docusate sodium (COLACE) 100 mg capsule Take 1 capsule (100 mg total) by mouth 3 times daily as needed    Menthol-Camphor (ICY  HOT ADVANCED PAIN RELIEF) 16-11 % CREA Apply sparingly to affected areas.    sodium chloride (OCEAN) 0.65 % nasal spray Spray 1 spray into each nostril as needed for Congestion     Review of Systems   Constitutional: Negative.    HENT: Negative.     Eyes: Negative.    Respiratory: Negative.     Cardiovascular: Negative.    Gastrointestinal:  Positive for abdominal distention.        Occasional abdominal cramping throughout   Endocrine: Negative.    Genitourinary:  Positive for frequency.        Nocturia   Musculoskeletal: Negative.    Skin: Negative.    Allergic/Immunologic: Negative.    Neurological: Negative.    Hematological: Negative.    Psychiatric/Behavioral: Negative.           Objective:    BP 136/76   Pulse 75   Temp 37 C (98.6 F)   Resp 19   Wt 91.2 kg (201 lb)   SpO2 98%   BMI 31.48 kg/m   Pain    07/09/22 0852   PainSc:   0 - No pain      Physical Exam  Vitals reviewed.   Constitutional:       Appearance: Normal appearance. He is normal weight.   HENT:      Head: Normocephalic and atraumatic.      Nose: Nose normal.      Mouth/Throat:      Mouth: Mucous membranes are moist.      Pharynx: Oropharynx is clear.   Eyes:      Conjunctiva/sclera: Conjunctivae normal.      Pupils: Pupils are equal, round, and reactive to light.   Cardiovascular:      Rate and Rhythm: Normal rate and regular rhythm.      Pulses: Normal pulses.      Heart sounds: Normal heart sounds. No murmur heard.     No friction rub. No gallop.   Pulmonary:      Effort: Pulmonary effort is normal. No respiratory distress.      Breath sounds: Normal breath sounds. No stridor. No wheezing, rhonchi or rales.   Chest:      Chest wall: No tenderness.   Abdominal:      General: Bowel sounds are normal. There is no distension.      Palpations: Abdomen is soft. There is no mass.      Tenderness: There is no abdominal tenderness. There is no guarding or rebound.      Hernia: No hernia is present.  Genitourinary:     Comments:  Deferred  Musculoskeletal:         General: Normal range of motion.      Cervical back: Normal range of motion.   Skin:     General: Skin is warm and dry.      Capillary Refill: Capillary refill takes less than 2 seconds.   Neurological:      General: No focal deficit present.      Mental Status: He is alert and oriented to person, place, and time.   Psychiatric:         Mood and Affect: Mood normal.         Behavior: Behavior normal.         Thought Content: Thought content normal.         Judgment: Judgment normal.                 Lab results: 06/06/22  1039   WBC 6.4   Hemoglobin 16.9   Hematocrit 50   RBC 5.9   Platelets 243           Lab results: 07/09/22  0946   Sodium 140   Potassium 4.2   Chloride 101   CO2 28   UN 9   Creatinine 0.83   Glucose 105*   Calcium 9.0   Total Protein 7.4   Albumin 4.2   ALT 23   AST 43   Alk Phos 83   Bilirubin,Total <0.2           Lab results: 01/04/22  0715   Cholesterol 203*   HDL 39*   LDL Calculated 140*   Triglycerides 122   Chol/HDL Ratio 5.2     No components found with this basename: "[facility]"    Lab Results   Component Value Date    HA1C 6.4 (H) 01/04/2022     Lab Results   Component Value Date    TSH 1.54 01/04/2022           Assessment:      1. Essential hypertension, benign    2. Hyperlipidemia, mixed    3. Abdominal aortic aneurysm (AAA) without rupture, unspecified part    4. Gastroesophageal reflux disease, unspecified whether esophagitis present    5. History of drug abuse    6. History of alcohol abuse    7. Prediabetes    8. Nocturia    9. Screening for colon cancer         Plan:      Update blood work  Evaluate for diabetic status, will explore additional medication once formal diagnosis is achieved  Cologuard colon cancer screening        Orders Placed This Encounter    Lipid Panel (Reflex to Direct  LDL if Triglycerides more than 400)    Comprehensive metabolic panel    PSA (eff.05-2008)    Hemoglobin A1c    Microalbumin, Urine, Random    Colon Cancer  Screening: Cologuard (External Lab - [facility])     There are no Patient Instructions on file for this visit.    Electronically signed by Britta Mccreedy, NP 07/09/2022 12:59 PM  [facility] - [facility], Phone: [phone]

## 2022-07-13 ENCOUNTER — Telehealth: Payer: Self-pay | Admitting: Family Medicine

## 2022-07-13 DIAGNOSIS — R7303 Prediabetes: Secondary | ICD-10-CM

## 2022-07-13 MED ORDER — METFORMIN HCL 500 MG PO TB24 *I*
500.0000 mg | ORAL_TABLET | Freq: Every day | ORAL | 1 refills | Status: DC
Start: 2022-07-13 — End: 2022-09-13

## 2022-07-13 NOTE — Telephone Encounter (Signed)
Provider reviewed.  

## 2022-07-13 NOTE — Telephone Encounter (Signed)
Sent metformin

## 2022-07-13 NOTE — Telephone Encounter (Signed)
Pt is agreeable to the metformin. He would like it sent to [facility] on [address] in [place]. Pt has medicaid. Can you please send this over for Caryn Bee?

## 2022-07-13 NOTE — Telephone Encounter (Signed)
-----   Message from Nurse Jearld Lesch sent at 07/13/2022  7:47 AM EDT -----  Please call this patient and inform him that he remains prediabetic with a hemoglobin A1c of 6.4.  He did have some creatinine in his urine which we will continue to monitor.  His cholesterol panel was within normal limits except for his good cholesterol which was level.  His prostate cancer screening was negative.  Ideally, we should institute medication such as metformin in order to prevent his transition from prediabetes to diabetes.  Let me know his thoughts in regards to this.

## 2022-07-14 ENCOUNTER — Ambulatory Visit: Admission: RE | Admit: 2022-07-14 | Payer: Medicaid (Managed Care) | Source: Ambulatory Visit | Admitting: Surgery

## 2022-07-14 ENCOUNTER — Telehealth: Payer: Self-pay | Admitting: Family Medicine

## 2022-07-14 ENCOUNTER — Encounter: Admission: RE | Payer: Self-pay | Source: Ambulatory Visit

## 2022-07-14 SURGERY — COLONOSCOPY
Anesthesia: Monitor Anesthesia Care | Site: Abdomen

## 2022-07-14 NOTE — Telephone Encounter (Signed)
Patient states he spoke to [place] at his 5/10 appointment abut getting something sent in for his ED can you check in to this.

## 2022-07-16 ENCOUNTER — Telehealth: Payer: Self-pay | Admitting: Family Medicine

## 2022-07-16 NOTE — Telephone Encounter (Signed)
Called and informed patient of message sent in previous encounter

## 2022-07-16 NOTE — Telephone Encounter (Signed)
Can we do this ?

## 2022-07-16 NOTE — Telephone Encounter (Signed)
Patient called stating he was wondering if he could have something sent to pharmacy for his ED. Patient states he had been on [place].

## 2022-07-25 LAB — AMB COLOGUARD (EXTERNAL LAB - EXACT SCIENCES): Cologuard (FIT DNA): NEGATIVE

## 2022-08-02 ENCOUNTER — Other Ambulatory Visit: Payer: Self-pay | Admitting: Family Medicine

## 2022-08-02 MED ORDER — FAMOTIDINE 20 MG PO TABS *I*
20.0000 mg | ORAL_TABLET | Freq: Every evening | ORAL | 3 refills | Status: AC | PRN
Start: 2022-08-02 — End: ?

## 2022-08-02 NOTE — Telephone Encounter (Signed)
Last appointment: 07/09/2022  Last telemedicine visit:  Visit date not found   Next appointment:   Future Appointments   Date Time Provider Department Center   08/03/2022  3:00 PM Britta Mccreedy, NP [facility] None

## 2022-08-03 ENCOUNTER — Ambulatory Visit: Payer: Medicaid (Managed Care) | Attending: Family Medicine | Admitting: Family Medicine

## 2022-08-03 ENCOUNTER — Other Ambulatory Visit: Payer: Self-pay

## 2022-08-03 VITALS — BP 126/74 | HR 66 | Wt 198.0 lb

## 2022-08-03 DIAGNOSIS — R1032 Left lower quadrant pain: Secondary | ICD-10-CM | POA: Insufficient documentation

## 2022-08-03 DIAGNOSIS — R300 Dysuria: Secondary | ICD-10-CM | POA: Insufficient documentation

## 2022-08-03 LAB — POCT URINALYSIS DIPSTICK
Bilirubin,Ur: NEGATIVE
Blood,UA POCT: NEGATIVE
Glucose,UA POCT: NORMAL mg/dL
Ketones,UA POCT: NEGATIVE mg/dL
Leuk Esterase,UA POCT: NEGATIVE
Lot #: 306027
Nitrite,UA POCT: NEGATIVE
PH,UA POCT: 6 (ref 5–8)
Specific gravity,UA POCT: 1.015 (ref 1.002–1.030)
Urobilinogen,UA: NORMAL mg/dL

## 2022-08-03 NOTE — Patient Instructions (Signed)
Please complete abdominal ultrasound as soon as possible.  Ensure that the nurses are rotating the injection sites and I will have my team contact them to ensure this is well.

## 2022-08-03 NOTE — Progress Notes (Signed)
[facility] - [facility]  [address] [place], [address]  [place] [address]  Phone: [phone]  Fax: [phone]     Patient ID: Jeremy Orr is a 47 y.o. male.    Chief Complaint   Patient presents with    Abdominal Pain     Off and on for 2 months.    Dysuria     At times, not having sex.     Jeremy Orr is a 47 year old male who presents [facility] seeking treatment for abdominal pain in his bilateral lower quadrants has been present for approximately 2 months intermittently.  He states that at times he can have dysuria and would like to rule out urinary tract infection.  He denies any new or high risk sexual contacts and denies screening for STD/STI.  He does not relate any of his abdominal pain with a specific time of day or pre-/post meals.  He does endorse that the pain is most prevalent in the regions where the nurses have been administering his Subicade injections.  He has noticed change in the skin of the region where he is being provided the [place] injection and would like these evaluated.          Medications Reviewed and changes   Current Outpatient Medications   Medication Sig    metFORMIN (GLUCOPHAGE-XR) 500 mg 24 hr tablet Take 1 tablet (500 mg total) by mouth daily (with dinner). Swallow whole. Do not crush, break, or chew.    lisinopril (PRINIVIL,ZESTRIL) 20 mg tablet Take 1 tablet (20 mg total) by mouth daily for High Blood Pressure Disorder.    atorvastatin (LIPITOR) 20 mg tablet Take 1 tablet (20 mg total) by mouth daily    levocetirizine (XYZAL) 5 mg tablet Take 1 tablet (5 mg total) by mouth every evening    cloNIDine (CATAPRES) 0.2 mg tablet Take 1 tablet (0.2 mg total) by mouth 2 times daily    buprenorphine-naloxone (SUBOXONE) 8-2 MG SL tablet Place 1 tablet under the tongue 2 times daily.    FLUoxetine (PROZAC) 40 mg capsule Take 1 capsule (40 mg total) by mouth daily    haloperidol (HALDOL) 0.5 mg tablet Take 1 tablet (0.5 mg total) by mouth at  bedtime    omeprazole (PRILOSEC) 40 mg capsule Take 1 capsule (40 mg total) by mouth daily    famotidine 20 mg tablet Take 1 tablet (20 mg total) by mouth nightly as needed for Heartburn.    polyethylene glycol (GLYCOLAX) powder Take 17 g by mouth daily Mix in 8 oz water or juice and drink. (Patient not taking: Reported on 07/09/2022)    amLODIPine (NORVASC) 10 mg tablet Take 1 tablet (10 mg total) by mouth daily (Patient not taking: Reported on 08/03/2022)    bisacodyl (DULCOLAX) 5 MG EC tablet Take as directed by the bowel prep protocol for colonoscopy (Patient not taking: Reported on 08/03/2022)    folic acid (FOLVITE) 1 mg tablet Take 1 tablet (1 mg total) by mouth daily (Patient not taking: Reported on 08/03/2022)    acamprosate (CAMPRAL) 333 mg tablet Take 2 tablets (666 mg total) by mouth 3 times daily Swallow whole. Do not crush, break, or chew. (Patient not taking: Reported on 08/03/2022)    albuterol HFA (PROVENTIL, VENTOLIN, PROAIR HFA) 108 (90 Base) MCG/ACT inhaler Inhale 1-2 puffs into the lungs every 4-6 hours as needed for Wheezing  Shake well before each use.    docusate sodium (COLACE) 100  mg capsule Take 1 capsule (100 mg total) by mouth 3 times daily as needed    Menthol-Camphor (ICY HOT ADVANCED PAIN RELIEF) 16-11 % CREA Apply sparingly to affected areas.    sodium chloride (OCEAN) 0.65 % nasal spray Spray 1 spray into each nostril as needed for Congestion     Review of Systems   Constitutional: Negative.    HENT: Negative.     Eyes: Negative.    Respiratory: Negative.     Cardiovascular: Negative.    Gastrointestinal:  Positive for abdominal pain.   Endocrine: Negative.    Genitourinary:  Positive for dysuria.   Musculoskeletal: Negative.    Skin:  Positive for color change.   Allergic/Immunologic: Negative.    Neurological: Negative.    Hematological: Negative.    Psychiatric/Behavioral: Negative.           Objective:    BP 126/74 (BP Location: Left arm, Patient Position: Sitting)   Pulse 66   Wt 89.8  kg (198 lb)   SpO2 97%   BMI 31.01 kg/m   There were no vitals filed for this visit.   Physical Exam  Vitals reviewed.   Constitutional:       Appearance: Normal appearance. He is normal weight.   HENT:      Head: Normocephalic and atraumatic.      Nose: Nose normal.      Mouth/Throat:      Mouth: Mucous membranes are moist.      Pharynx: Oropharynx is clear.   Eyes:      Conjunctiva/sclera: Conjunctivae normal.      Pupils: Pupils are equal, round, and reactive to light.   Cardiovascular:      Pulses: Normal pulses.   Pulmonary:      Effort: Pulmonary effort is normal.   Abdominal:      General: Bowel sounds are normal. There is no distension.      Palpations: Abdomen is soft. There is no mass.      Tenderness: There is abdominal tenderness. There is no right CVA tenderness, left CVA tenderness, guarding or rebound. Negative signs include Rovsing's sign, McBurney's sign and obturator sign.      Hernia: No hernia is present. There is no hernia in the umbilical area.          Comments: Thickened areas of skin that are painful to the touch.  No erythema although regions are darker in color.  Patient states that these regions are the alternating sites of his [place] injections/     Musculoskeletal:         General: Normal range of motion.      Cervical back: Normal range of motion.   Skin:     General: Skin is warm and dry.      Capillary Refill: Capillary refill takes less than 2 seconds.   Neurological:      General: No focal deficit present.      Mental Status: He is alert and oriented to person, place, and time.   Psychiatric:         Mood and Affect: Mood normal.         Behavior: Behavior normal.         Thought Content: Thought content normal.         Judgment: Judgment normal.                 Lab results: 06/06/22  1039   WBC 6.4   Hemoglobin 16.9  Hematocrit 50   RBC 5.9   Platelets 243           Lab results: 07/09/22  0946   Sodium 140   Potassium 4.2   Chloride 101   CO2 28   UN 9   Creatinine 0.83    Glucose 105*   Calcium 9.0   Total Protein 7.4   Albumin 4.2   ALT 23   AST 43   Alk Phos 83   Bilirubin,Total <0.2           Lab results: 07/09/22  0946   Cholesterol 181   HDL 31*   LDL Calculated 121   Triglycerides 144   Chol/HDL Ratio 5.8     No components found with this basename: "[facility]"    Lab Results   Component Value Date    HA1C 6.4 (H) 07/09/2022     Lab Results   Component Value Date    TSH 1.54 01/04/2022           Assessment:      1. Left lower quadrant abdominal pain    2. Dysuria           Plan:      Nurse team to contact the nursing staff at [facility] and ensure that the injection sites are rotated more frequently.  Patient to complete abdominal ultrasound.        Orders Placed This Encounter    US abdominal complete    POCT Urinalysis Dipstick     Patient Instructions   Please complete abdominal ultrasound as soon as possible.  Ensure that the nurses are rotating the injection sites and I will have my team contact them to ensure this is well.    Electronically signed by Britta Mccreedy, NP 08/03/2022 3:27 PM  [facility] - [facility], Phone: [phone]

## 2022-08-05 ENCOUNTER — Ambulatory Visit: Payer: Medicaid (Managed Care)

## 2022-08-11 ENCOUNTER — Telehealth: Payer: Self-pay | Admitting: Family Medicine

## 2022-08-11 ENCOUNTER — Other Ambulatory Visit: Payer: Self-pay

## 2022-08-11 ENCOUNTER — Ambulatory Visit
Admission: RE | Admit: 2022-08-11 | Discharge: 2022-08-11 | Disposition: A | Discharge status: Home / Self Care | Payer: Medicaid (Managed Care) | Source: Ambulatory Visit | Attending: Family Medicine | Admitting: Family Medicine

## 2022-08-11 DIAGNOSIS — I77819 Aortic ectasia, unspecified site: Secondary | ICD-10-CM | POA: Insufficient documentation

## 2022-08-11 DIAGNOSIS — R1032 Left lower quadrant pain: Secondary | ICD-10-CM | POA: Insufficient documentation

## 2022-08-11 NOTE — Telephone Encounter (Signed)
-----   Message from Verta Ellen, NP sent at 08/11/2022 10:34 AM EDT -----  Please call this patient and inform him that there was no acute findings on his complete abdominal ultrasound.

## 2022-08-11 NOTE — Telephone Encounter (Signed)
Called and discussed with patient

## 2022-09-13 ENCOUNTER — Other Ambulatory Visit: Payer: Self-pay | Admitting: Family Medicine

## 2022-09-13 DIAGNOSIS — R7303 Prediabetes: Secondary | ICD-10-CM

## 2022-09-13 DIAGNOSIS — I10 Essential (primary) hypertension: Secondary | ICD-10-CM

## 2022-09-13 MED ORDER — AMLODIPINE BESYLATE 10 MG PO TABS *I*
10.0000 mg | ORAL_TABLET | Freq: Every day | ORAL | 1 refills | Status: DC
Start: 2022-09-13 — End: 2022-12-21

## 2022-09-13 MED ORDER — LISINOPRIL 20 MG PO TABS *I*
20.0000 mg | ORAL_TABLET | Freq: Every day | ORAL | 1 refills | Status: DC
Start: 2022-09-13 — End: 2022-12-21

## 2022-09-13 MED ORDER — METFORMIN HCL 500 MG PO TB24 *I*
500.0000 mg | ORAL_TABLET | Freq: Every day | ORAL | 1 refills | Status: DC
Start: 2022-09-13 — End: 2022-12-21

## 2022-09-13 NOTE — Telephone Encounter (Signed)
Last appointment: 08/03/2022  Next appointment: No future appointments.

## 2022-12-21 ENCOUNTER — Other Ambulatory Visit: Payer: Self-pay | Admitting: Family Medicine

## 2022-12-21 DIAGNOSIS — R7303 Prediabetes: Secondary | ICD-10-CM

## 2022-12-21 DIAGNOSIS — I10 Essential (primary) hypertension: Secondary | ICD-10-CM

## 2022-12-21 MED ORDER — METFORMIN HCL 500 MG PO TB24 *I*
500.0000 mg | ORAL_TABLET | Freq: Every day | ORAL | 1 refills | Status: DC
Start: 2022-12-21 — End: 2023-03-14

## 2022-12-21 MED ORDER — AMLODIPINE BESYLATE 10 MG PO TABS *I*
10.0000 mg | ORAL_TABLET | Freq: Every day | ORAL | 1 refills | Status: DC
Start: 2022-12-21 — End: 2023-06-13

## 2022-12-21 MED ORDER — CLONIDINE HCL 0.2 MG PO TABS *I*
0.2000 mg | ORAL_TABLET | Freq: Two times a day (BID) | ORAL | 3 refills | Status: DC
Start: 2022-12-21 — End: 2023-03-14

## 2022-12-21 MED ORDER — LISINOPRIL 20 MG PO TABS *I*
20.0000 mg | ORAL_TABLET | Freq: Every day | ORAL | 1 refills | Status: DC
Start: 2022-12-21 — End: 2023-03-14

## 2022-12-21 NOTE — Telephone Encounter (Signed)
Last appointment: 08/03/2022  Next appointment:   Future Appointments   Date Time Provider Department Center   12/29/2022 11:00 AM Britta Mccreedy, NP [facility] None

## 2022-12-28 ENCOUNTER — Telehealth: Payer: Self-pay | Admitting: Family Medicine

## 2022-12-28 NOTE — Telephone Encounter (Signed)
Patient needs transportation set up for an appointment with [place] in [place] on Tuesday 01/04/23 @ 1:20 pm. Patient can be reached at [phone]

## 2022-12-28 NOTE — Telephone Encounter (Signed)
Noted! Thank you

## 2022-12-29 ENCOUNTER — Ambulatory Visit: Payer: Medicaid (Managed Care) | Admitting: Family Medicine

## 2023-01-04 ENCOUNTER — Ambulatory Visit: Payer: Medicaid (Managed Care) | Admitting: Family Medicine

## 2023-01-04 NOTE — Progress Notes (Deleted)
[facility] - [facility]  [address] [place], [address]  [place] [address]  Phone: [phone]  Fax: [phone]     Patient ID: Jeremy Orr is a 47 y.o. male.    No chief complaint on file.    HPI    Jeremy Orr is a 47 y.o. male who presents to [facility] with concerns regarding right leg pain.  He was last evaluated in office by myself on 9//2024.        Medications Reviewed and changes   Current Outpatient Medications   Medication Sig    metFORMIN (GLUCOPHAGE-XR) 500 mg 24 hr tablet Take 1 tablet (500 mg total) by mouth daily (with dinner). Swallow whole. Do not crush, break, or chew.    amLODIPine (NORVASC) 10 mg tablet Take 1 tablet (10 mg total) by mouth daily.    lisinopril (PRINIVIL,ZESTRIL) 20 mg tablet Take 1 tablet (20 mg total) by mouth daily for High Blood Pressure.    cloNIDine (CATAPRES) 0.2 mg tablet Take 1 tablet (0.2 mg total) by mouth 2 times daily.    famotidine 20 mg tablet Take 1 tablet (20 mg total) by mouth nightly as needed for Heartburn.    polyethylene glycol (GLYCOLAX) powder Take 17 g by mouth daily Mix in 8 oz water or juice and drink. (Patient not taking: Reported on 07/09/2022)    bisacodyl (DULCOLAX) 5 MG EC tablet Take as directed by the bowel prep protocol for colonoscopy (Patient not taking: Reported on 08/03/2022)    folic acid (FOLVITE) 1 mg tablet Take 1 tablet (1 mg total) by mouth daily (Patient not taking: Reported on 08/03/2022)    atorvastatin (LIPITOR) 20 mg tablet Take 1 tablet (20 mg total) by mouth daily    levocetirizine (XYZAL) 5 mg tablet Take 1 tablet (5 mg total) by mouth every evening    acamprosate (CAMPRAL) 333 mg tablet Take 2 tablets (666 mg total) by mouth 3 times daily Swallow whole. Do not crush, break, or chew. (Patient not taking: Reported on 08/03/2022)    buprenorphine-naloxone (SUBOXONE) 8-2 MG SL tablet Place 1 tablet under the tongue 2 times daily.    albuterol HFA (PROVENTIL, VENTOLIN, PROAIR HFA) 108 (90 Base)  MCG/ACT inhaler Inhale 1-2 puffs into the lungs every 4-6 hours as needed for Wheezing  Shake well before each use.    docusate sodium (COLACE) 100 mg capsule Take 1 capsule (100 mg total) by mouth 3 times daily as needed    FLUoxetine (PROZAC) 40 mg capsule Take 1 capsule (40 mg total) by mouth daily    haloperidol (HALDOL) 0.5 mg tablet Take 1 tablet (0.5 mg total) by mouth at bedtime    Menthol-Camphor (ICY HOT ADVANCED PAIN RELIEF) 16-11 % CREA Apply sparingly to affected areas.    omeprazole (PRILOSEC) 40 mg capsule Take 1 capsule (40 mg total) by mouth daily    sodium chloride (OCEAN) 0.65 % nasal spray Spray 1 spray into each nostril as needed for Congestion     Review of Systems      Objective:    There were no vitals taken for this visit.  There were no vitals filed for this visit.   Physical Exam            Lab results: 06/06/22  1039   WBC 6.4   Hemoglobin 16.9   Hematocrit 50   RBC 5.9   Platelets 243  Lab results: 07/09/22  0946   Sodium 140   Potassium 4.2   Chloride 101   CO2 28   UN 9   Creatinine 0.83   Glucose 105*   Calcium 9.0   Total Protein 7.4   Albumin 4.2   ALT 23   AST 43   Alk Phos 83   Bilirubin,Total <0.2           Lab results: 07/09/22  0946   Cholesterol 181   HDL 31*   LDL Calculated 121   Triglycerides 144   Chol/HDL Ratio 5.8     No components found with this basename: "[facility]"    Lab Results   Component Value Date    HA1C 6.4 (H) 07/09/2022     Lab Results   Component Value Date    TSH 1.54 01/04/2022           Assessment:      No diagnosis found.  ***     Plan:      ***        Orders Placed This Encounter   No orders placed during this encounter.     There are no Patient Instructions on file for this visit.    Electronically signed by Britta Mccreedy, NP 01/04/2023 7:28 AM  [facility] - [facility], Phone: [phone]

## 2023-01-25 ENCOUNTER — Encounter: Payer: Self-pay | Admitting: Family Medicine

## 2023-03-14 ENCOUNTER — Telehealth: Payer: Self-pay | Admitting: Family Medicine

## 2023-03-14 ENCOUNTER — Other Ambulatory Visit: Payer: Self-pay | Admitting: Family Medicine

## 2023-03-14 DIAGNOSIS — I10 Essential (primary) hypertension: Secondary | ICD-10-CM

## 2023-03-14 DIAGNOSIS — R7303 Prediabetes: Secondary | ICD-10-CM

## 2023-03-14 MED ORDER — CLONIDINE HCL 0.2 MG PO TABS *I*
0.2000 mg | ORAL_TABLET | Freq: Two times a day (BID) | ORAL | 3 refills | Status: AC
Start: 2023-03-14 — End: ?

## 2023-03-14 MED ORDER — LISINOPRIL 20 MG PO TABS *I*
20.0000 mg | ORAL_TABLET | Freq: Every day | ORAL | 1 refills | Status: AC
Start: 2023-03-14 — End: ?

## 2023-03-14 MED ORDER — METFORMIN HCL 500 MG PO TB24 *I*
500.0000 mg | ORAL_TABLET | Freq: Every day | ORAL | 1 refills | Status: AC
Start: 2023-03-14 — End: ?

## 2023-03-14 NOTE — Telephone Encounter (Addendum)
Patient needs transportation sent up for his appointment here on 03/29/2023 @ 11am    2020 form completed and will schedule transportation once its approved

## 2023-03-14 NOTE — Telephone Encounter (Signed)
Last appt: 08/03/2022  Future appt:   Future Appointments   Date Time Provider Department Center   03/29/2023 11:00 AM Britta Mccreedy, NP [facility] None

## 2023-03-16 ENCOUNTER — Telehealth: Payer: Self-pay

## 2023-03-16 NOTE — Telephone Encounter (Signed)
 Writer reviewed the 2020 form in [employer] to schedule transportation and form is pending review.   Will continue to check on form.

## 2023-03-17 ENCOUNTER — Telehealth: Payer: Self-pay

## 2023-03-17 NOTE — Telephone Encounter (Signed)
 Writer attempted to contact Dessie @ [phone]:  First call, there was an answer however it line was silent  Second call, phone didn't ring or go to a VM  Purpose of the call is to inform Devonne the 2020 for [employer] transport was denied and he will be responsible for his own transportation.    Writer contacted [employer] mother, Cira Rue to ensure this was an updated number for Darius.-1 [phone] is the correct number for Davidlee.  This is the correct number. Cira Rue is going to contact [employer] and request he Higher education careers adviser.      Writer received a call from [facility] who stated he didn't receive any calls from Clinical research associate.  Rand is only staying in [place] until July and will be moving out of state.  Writer is going to submit a new 2020 form with this information to see if [employer] will approve the transportation.       Will continue to monitor.

## 2023-03-18 ENCOUNTER — Encounter: Payer: Self-pay | Admitting: Family Medicine

## 2023-03-18 ENCOUNTER — Telehealth: Payer: Self-pay

## 2023-03-18 NOTE — Telephone Encounter (Signed)
 Writer received an email from [employer] stating they need a LMN for Jeremy Orr to continue seeing Jeremy Orr.     Writer submitted letter of medial necessity to [employer] via fax :   fax to [phone]     Jeremy Orr still has a legal address of [place] and it was advised by [employer] he contact DSS to provide them with his currently address.    Writer attempted to contact Jeremy Orr @ 1 [phone]:   No answer and unable to leave VM.      End note

## 2023-03-29 ENCOUNTER — Ambulatory Visit: Payer: Medicaid (Managed Care) | Admitting: Family Medicine

## 2023-03-31 ENCOUNTER — Encounter: Payer: Self-pay | Admitting: Family Medicine

## 2023-04-28 ENCOUNTER — Encounter: Payer: Self-pay | Admitting: Family Medicine

## 2023-06-13 ENCOUNTER — Other Ambulatory Visit: Payer: Self-pay | Admitting: Family Medicine

## 2023-06-13 DIAGNOSIS — I10 Essential (primary) hypertension: Secondary | ICD-10-CM

## 2023-06-13 NOTE — Telephone Encounter (Signed)
Last appointment: 08/03/2022  Next appointment: No future appointments.

## 8350-05-31 DEATH — deceased
